# Patient Record
Sex: Male | Born: 1970 | Race: Black or African American | Hispanic: No | Marital: Single | Smoking: Never smoker
Health system: Southern US, Community
[De-identification: ages and names within clinical notes are randomized; demographics above are authoritative.]

## PROBLEM LIST (undated history)

## (undated) DIAGNOSIS — I82409 Acute embolism and thrombosis of unspecified deep veins of unspecified lower extremity: Secondary | ICD-10-CM

## (undated) DIAGNOSIS — I10 Essential (primary) hypertension: Secondary | ICD-10-CM

## (undated) DIAGNOSIS — I2699 Other pulmonary embolism without acute cor pulmonale: Secondary | ICD-10-CM

---

## 1994-08-28 HISTORY — PX: MANDIBLE SURGERY: SHX707

## 2006-02-04 ENCOUNTER — Emergency Department: Payer: Self-pay | Admitting: Unknown Physician Specialty

## 2009-03-28 ENCOUNTER — Ambulatory Visit: Payer: Self-pay | Admitting: Internal Medicine

## 2009-04-19 ENCOUNTER — Inpatient Hospital Stay: Payer: Self-pay | Admitting: Internal Medicine

## 2009-04-28 ENCOUNTER — Ambulatory Visit: Payer: Self-pay | Admitting: Internal Medicine

## 2009-06-15 ENCOUNTER — Ambulatory Visit: Payer: Self-pay | Admitting: Adult Health

## 2009-06-28 ENCOUNTER — Emergency Department: Payer: Self-pay | Admitting: Emergency Medicine

## 2009-09-30 ENCOUNTER — Emergency Department: Payer: Self-pay | Admitting: Emergency Medicine

## 2009-12-01 ENCOUNTER — Ambulatory Visit: Payer: Self-pay | Admitting: Adult Health

## 2009-12-21 ENCOUNTER — Ambulatory Visit: Payer: Self-pay | Admitting: Adult Health

## 2010-01-13 ENCOUNTER — Emergency Department: Payer: Self-pay | Admitting: Emergency Medicine

## 2010-05-15 ENCOUNTER — Emergency Department: Payer: Self-pay | Admitting: Emergency Medicine
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
## 2011-08-31 ENCOUNTER — Inpatient Hospital Stay: Payer: Self-pay | Admitting: Student

## 2011-08-31 LAB — PROTIME-INR
INR: 0.9
Prothrombin Time: 12 secs (ref 11.5–14.7)

## 2011-08-31 LAB — BASIC METABOLIC PANEL
Anion Gap: 10 (ref 7–16)
BUN: 9 mg/dL (ref 7–18)
Calcium, Total: 8.6 mg/dL (ref 8.5–10.1)
Chloride: 104 mmol/L (ref 98–107)
Co2: 29 mmol/L (ref 21–32)
Creatinine: 0.92 mg/dL (ref 0.60–1.30)
Potassium: 3.6 mmol/L (ref 3.5–5.1)

## 2011-08-31 LAB — CBC
MCHC: 33.6 g/dL (ref 32.0–36.0)
RBC: 4.97 10*6/uL (ref 4.40–5.90)

## 2011-08-31 LAB — CK TOTAL AND CKMB (NOT AT ARMC)
CK, Total: 195 U/L (ref 35–232)
CK-MB: 0.6 ng/mL (ref 0.5–3.6)

## 2011-08-31 LAB — TROPONIN I
Troponin-I: <0.02 ng/mL
Troponin-I: 0.03 ng/mL

## 2011-09-01 LAB — CBC WITH DIFFERENTIAL/PLATELET
Eosinophil #: 0.2 10*3/uL (ref 0.0–0.7)
Eosinophil %: 3.8 %
HCT: 40 % (ref 40.0–52.0)
HGB: 13.4 g/dL (ref 13.0–18.0)
Lymphocyte #: 1.9 10*3/uL (ref 1.0–3.6)
MCH: 28.7 pg (ref 26.0–34.0)
MCHC: 33.6 g/dL (ref 32.0–36.0)
MCV: 85 fL (ref 80–100)
Monocyte #: 0.6 10*3/uL (ref 0.0–0.7)
Neutrophil #: 1.4 10*3/uL (ref 1.4–6.5)
Neutrophil %: 35 %
Platelet: 166 10*3/uL (ref 150–440)
RBC: 4.69 10*6/uL (ref 4.40–5.90)

## 2011-09-01 LAB — BASIC METABOLIC PANEL
Anion Gap: 7 (ref 7–16)
Calcium, Total: 8.4 mg/dL — ABNORMAL LOW (ref 8.5–10.1)
Co2: 30 mmol/L (ref 21–32)
EGFR (Non-African Amer.): >60
Glucose: 126 mg/dL — ABNORMAL HIGH (ref 65–99)
Osmolality: 283 (ref 275–301)
Potassium: 4.4 mmol/L (ref 3.5–5.1)
Sodium: 142 mmol/L (ref 136–145)

## 2011-09-01 LAB — HEPATIC FUNCTION PANEL A (ARMC)
Albumin: 3.1 g/dL — ABNORMAL LOW (ref 3.4–5.0)
Alkaline Phosphatase: 86 U/L (ref 50–136)
Bilirubin,Total: 0.3 mg/dL (ref 0.2–1.0)
SGOT(AST): 40 U/L — ABNORMAL HIGH (ref 15–37)
SGPT (ALT): 51 U/L

## 2011-09-01 LAB — LIPID PANEL
Cholesterol: 174 mg/dL (ref 0–200)
HDL Cholesterol: 73 mg/dL — ABNORMAL HIGH (ref 40–60)
Triglycerides: 144 mg/dL (ref 0–200)
VLDL Cholesterol, Calc: 29 mg/dL (ref 5–40)

## 2011-09-01 LAB — CK TOTAL AND CKMB (NOT AT ARMC): CK-MB: <0.5 ng/mL — ABNORMAL LOW (ref 0.5–3.6)

## 2011-09-01 LAB — LIPASE, BLOOD: Lipase: 245 U/L (ref 73–393)

## 2011-09-01 LAB — HEMOGLOBIN A1C: Hemoglobin A1C: 5.3 % (ref 4.2–6.3)

## 2011-09-02 LAB — PROTIME-INR: INR: 1

## 2011-09-03 LAB — PROTIME-INR
INR: 1
Prothrombin Time: 13.4 secs (ref 11.5–14.7)

## 2011-09-04 LAB — PROTIME-INR: INR: 1.1

## 2011-09-05 LAB — PROTIME-INR: INR: 1.2

## 2012-03-17 ENCOUNTER — Inpatient Hospital Stay: Payer: Self-pay | Admitting: Internal Medicine

## 2012-03-17 LAB — CBC WITH DIFFERENTIAL/PLATELET
Basophil %: 2.1 %
Eosinophil %: 1.2 %
HCT: 44.9 % (ref 40.0–52.0)
HGB: 14.4 g/dL (ref 13.0–18.0)
Lymphocyte #: 1.8 10*3/uL (ref 1.0–3.6)
MCH: 27.7 pg (ref 26.0–34.0)
MCV: 86 fL (ref 80–100)
Monocyte #: 0.5 x10 3/mm (ref 0.2–1.0)
RBC: 5.21 10*6/uL (ref 4.40–5.90)
RDW: 14.1 % (ref 11.5–14.5)

## 2012-03-17 LAB — COMPREHENSIVE METABOLIC PANEL
Alkaline Phosphatase: 106 U/L (ref 50–136)
Anion Gap: 10 (ref 7–16)
BUN: 12 mg/dL (ref 7–18)
Bilirubin,Total: 0.3 mg/dL (ref 0.2–1.0)
Calcium, Total: 7.9 mg/dL — ABNORMAL LOW (ref 8.5–10.1)
Chloride: 106 mmol/L (ref 98–107)
Co2: 26 mmol/L (ref 21–32)
Creatinine: 1.14 mg/dL (ref 0.60–1.30)
EGFR (Non-African Amer.): >60
Osmolality: 284 (ref 275–301)
Potassium: 4.1 mmol/L (ref 3.5–5.1)
Sodium: 142 mmol/L (ref 136–145)

## 2012-03-17 LAB — CBC
HCT: 36.1 % — ABNORMAL LOW (ref 40.0–52.0)
MCV: 86 fL (ref 80–100)
RBC: 4.22 10*6/uL — ABNORMAL LOW (ref 4.40–5.90)
WBC: 7 10*3/uL (ref 3.8–10.6)

## 2012-03-17 LAB — PROTIME-INR: Prothrombin Time: 59.6 secs — ABNORMAL HIGH (ref 11.5–14.7)

## 2012-03-17 LAB — ETHANOL: Ethanol %: 0.293 % — ABNORMAL HIGH (ref 0.000–0.080)

## 2012-03-18 LAB — CBC WITH DIFFERENTIAL/PLATELET
Basophil #: 0 10*3/uL (ref 0.0–0.1)
Basophil %: 0.8 %
Eosinophil %: 2.6 %
HCT: 31.3 % — ABNORMAL LOW (ref 40.0–52.0)
Lymphocyte #: 1 10*3/uL (ref 1.0–3.6)
Lymphocyte %: 20.9 %
MCH: 28.5 pg (ref 26.0–34.0)
MCV: 86 fL (ref 80–100)
Monocyte #: 0.6 x10 3/mm (ref 0.2–1.0)
Monocyte %: 11.6 %
Neutrophil #: 3.1 10*3/uL (ref 1.4–6.5)
Platelet: 104 10*3/uL — ABNORMAL LOW (ref 150–440)
RBC: 3.64 10*6/uL — ABNORMAL LOW (ref 4.40–5.90)
RDW: 14 % (ref 11.5–14.5)
WBC: 4.8 10*3/uL (ref 3.8–10.6)

## 2012-03-18 LAB — COMPREHENSIVE METABOLIC PANEL
Alkaline Phosphatase: 78 U/L (ref 50–136)
Calcium, Total: 8 mg/dL — ABNORMAL LOW (ref 8.5–10.1)
Chloride: 102 mmol/L (ref 98–107)
Co2: 25 mmol/L (ref 21–32)
EGFR (African American): >60
EGFR (Non-African Amer.): >60
Osmolality: 280 (ref 275–301)
SGOT(AST): 36 U/L (ref 15–37)
SGPT (ALT): 31 U/L
Sodium: 141 mmol/L (ref 136–145)

## 2012-03-18 LAB — PROTIME-INR: INR: 2.6

## 2012-03-19 LAB — CBC WITH DIFFERENTIAL/PLATELET
Basophil #: 0 10*3/uL (ref 0.0–0.1)
Basophil %: 0.4 %
Eosinophil #: 0.1 10*3/uL (ref 0.0–0.7)
Eosinophil %: 2.6 %
HCT: 32.3 % — ABNORMAL LOW (ref 40.0–52.0)
Lymphocyte #: 0.9 10*3/uL — ABNORMAL LOW (ref 1.0–3.6)
Lymphocyte %: 23 %
MCH: 28.1 pg (ref 26.0–34.0)
MCHC: 32.5 g/dL (ref 32.0–36.0)
MCV: 87 fL (ref 80–100)
Neutrophil %: 59.3 %
RBC: 3.73 10*6/uL — ABNORMAL LOW (ref 4.40–5.90)
RDW: 13.8 % (ref 11.5–14.5)

## 2012-03-19 LAB — PROTIME-INR
INR: 2
Prothrombin Time: 23.1 secs — ABNORMAL HIGH (ref 11.5–14.7)

## 2012-03-20 LAB — PROTIME-INR: INR: 1.5

## 2012-04-28 ENCOUNTER — Emergency Department: Payer: Self-pay | Admitting: Emergency Medicine

## 2012-07-12 ENCOUNTER — Ambulatory Visit: Payer: Self-pay | Admitting: Hematology and Oncology

## 2012-07-28 ENCOUNTER — Ambulatory Visit: Payer: Self-pay | Admitting: Hematology and Oncology

## 2012-12-18 ENCOUNTER — Observation Stay: Payer: Self-pay | Admitting: Internal Medicine

## 2012-12-18 ENCOUNTER — Ambulatory Visit: Payer: Self-pay | Admitting: Internal Medicine

## 2012-12-18 LAB — CBC
HCT: 38.1 % — ABNORMAL LOW (ref 40.0–52.0)
HGB: 12.3 g/dL — ABNORMAL LOW (ref 13.0–18.0)
MCH: 24.2 pg — ABNORMAL LOW (ref 26.0–34.0)
RBC: 5.07 10*6/uL (ref 4.40–5.90)
RDW: 22.2 % — ABNORMAL HIGH (ref 11.5–14.5)

## 2012-12-18 LAB — PROTIME-INR
INR: 1.7
Prothrombin Time: 20.2 secs — ABNORMAL HIGH (ref 11.5–14.7)

## 2012-12-18 LAB — BASIC METABOLIC PANEL
Anion Gap: 4 — ABNORMAL LOW (ref 7–16)
Co2: 30 mmol/L (ref 21–32)
Creatinine: 0.9 mg/dL (ref 0.60–1.30)
EGFR (African American): >60
EGFR (Non-African Amer.): >60
Glucose: 80 mg/dL (ref 65–99)
Osmolality: 278 (ref 275–301)

## 2012-12-19 LAB — CBC WITH DIFFERENTIAL/PLATELET
Basophil #: 0 10*3/uL (ref 0.0–0.1)
HCT: 36.9 % — ABNORMAL LOW (ref 40.0–52.0)
HGB: 12.1 g/dL — ABNORMAL LOW (ref 13.0–18.0)
Lymphocyte #: 1.6 10*3/uL (ref 1.0–3.6)
Lymphocyte %: 33.2 %
MCH: 24.6 pg — ABNORMAL LOW (ref 26.0–34.0)
MCHC: 32.7 g/dL (ref 32.0–36.0)
MCV: 75 fL — ABNORMAL LOW (ref 80–100)
Monocyte #: 0.6 x10 3/mm (ref 0.2–1.0)
Neutrophil %: 48 %
Platelet: 307 10*3/uL (ref 150–440)
RBC: 4.89 10*6/uL (ref 4.40–5.90)
RDW: 22.4 % — ABNORMAL HIGH (ref 11.5–14.5)
WBC: 4.7 10*3/uL (ref 3.8–10.6)

## 2012-12-19 LAB — BASIC METABOLIC PANEL
Anion Gap: 4 — ABNORMAL LOW (ref 7–16)
BUN: 11 mg/dL (ref 7–18)
Calcium, Total: 8.5 mg/dL (ref 8.5–10.1)
Chloride: 106 mmol/L (ref 98–107)
EGFR (African American): >60
EGFR (Non-African Amer.): >60
Glucose: 111 mg/dL — ABNORMAL HIGH (ref 65–99)
Osmolality: 278 (ref 275–301)

## 2012-12-19 LAB — PROTIME-INR
INR: 1.9
Prothrombin Time: 19.5 secs — ABNORMAL HIGH (ref 11.5–14.7)

## 2013-02-18 ENCOUNTER — Ambulatory Visit: Payer: Self-pay

## 2014-03-10 ENCOUNTER — Emergency Department: Payer: Self-pay | Admitting: Emergency Medicine

## 2014-03-10 LAB — CBC
HCT: 43.7 % (ref 40.0–52.0)
HGB: 13.9 g/dL (ref 13.0–18.0)
MCH: 24.8 pg — AB (ref 26.0–34.0)
MCHC: 31.7 g/dL — ABNORMAL LOW (ref 32.0–36.0)
MCV: 78 fL — ABNORMAL LOW (ref 80–100)
Platelet: 231 10*3/uL (ref 150–440)
RBC: 5.59 10*6/uL (ref 4.40–5.90)
RDW: 18.7 % — AB (ref 11.5–14.5)
WBC: 4.7 10*3/uL (ref 3.8–10.6)

## 2014-03-10 LAB — COMPREHENSIVE METABOLIC PANEL
ALBUMIN: 3.6 g/dL (ref 3.4–5.0)
Alkaline Phosphatase: 106 U/L
Anion Gap: 12 (ref 7–16)
BILIRUBIN TOTAL: 0.2 mg/dL (ref 0.2–1.0)
BUN: 8 mg/dL (ref 7–18)
CHLORIDE: 106 mmol/L (ref 98–107)
Calcium, Total: 8.1 mg/dL — ABNORMAL LOW (ref 8.5–10.1)
Co2: 20 mmol/L — ABNORMAL LOW (ref 21–32)
Creatinine: 0.98 mg/dL (ref 0.60–1.30)
EGFR (African American): >60
EGFR (Non-African Amer.): >60
Glucose: 86 mg/dL (ref 65–99)
Osmolality: 273 (ref 275–301)
POTASSIUM: 3.8 mmol/L (ref 3.5–5.1)
SGOT(AST): 63 U/L — ABNORMAL HIGH (ref 15–37)
SGPT (ALT): 42 U/L (ref 12–78)
Sodium: 138 mmol/L (ref 136–145)
Total Protein: 8.5 g/dL — ABNORMAL HIGH (ref 6.4–8.2)

## 2014-03-10 LAB — ETHANOL
ETHANOL LVL: 216 mg/dL
Ethanol %: 0.216 % — ABNORMAL HIGH (ref 0.000–0.080)

## 2014-03-10 LAB — PROTIME-INR
INR: 0.9
Prothrombin Time: 12.2 secs (ref 11.5–14.7)

## 2014-03-10 LAB — ACETAMINOPHEN LEVEL: Acetaminophen: <2 ug/mL

## 2014-03-10 LAB — SALICYLATE LEVEL: Salicylates, Serum: <1.7 mg/dL

## 2014-03-21 ENCOUNTER — Other Ambulatory Visit: Payer: Self-pay

## 2014-03-21 LAB — PROTIME-INR
INR: 1.4
PROTHROMBIN TIME: 17.2 s — AB (ref 11.5–14.7)
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
## 2014-12-15 NOTE — Consult Note (Signed)
Chief Complaint:   Subjective/Chief Complaint nasal packing came out today.  small amount of blood with that occuring, not now.  denies abdominal pain, n/v.  bm this am not dark as previously.   VITAL SIGNS/ANCILLARY NOTES: **Vital Signs.:   24-Jul-13 04:39   Vital Signs Type Routine   Temperature Temperature (F) 98   Celsius 36.6   Temperature Source Oral   Pulse Pulse 67   Respirations Respirations 20   Systolic BP Systolic BP 128   Diastolic BP (mmHg) Diastolic BP (mmHg) 79   Mean BP 95   Pulse Ox % Pulse Ox % 96   Pulse Ox Activity Level  At rest   Oxygen Delivery Room Air/ 21 %   Brief Assessment:   Cardiac Regular    Respiratory clear BS    Gastrointestinal details normal Soft  Nontender  Nondistended  No masses palpable  Bowel sounds normal  No rebound tenderness   Lab Results: Routine Coag:  24-Jul-13 07:05    Prothrombin  18.5   INR 1.5 (INR reference interval applies to patients on anticoagulant therapy. A single INR therapeutic range for coumarins is not optimal for all indications; however, the suggested range for most indications is 2.0 - 3.0. Exceptions to the INR Reference Range may include: Prosthetic heart valves, acute myocardial infarction, prevention of myocardial infarction, and combinations of aspirin and anticoagulant. The need for a higher or lower target INR must be assessed individually. Reference: The Pharmacology and Management of the Vitamin K  antagonists: the seventh ACCP Conference on Antithrombotic and Thrombolytic Therapy. Chest.2004 Sept:126 (3suppl): [id number]. A HCT value >55% may artifactually increase the PT.  In one study,  the increase was an average of 25%. Reference:  "Effect on Routine and Special Coagulation Testing Values of Citrate Anticoagulant Adjustment in Patients with High HCT Values." American Journal of Clinical Pathology 2006;126:400-405.)   Assessment/Plan:  Assessment/Plan:   Assessment 1) melena-from nasal  trauma versus primary GI bleeding.  not recurrent.  2) history of etoh abuse/use-ct showing marked fatty liver. 3) history of protein c deficiency, coumadin supratheraputic on admission-possible secondary to #2    Plan 1) EGD today.  I have discussed the risks benefits and complications of egd to inculde not limited to bleeding infection perforation and sedation and he wishes to proceed.   Electronic Signatures: Barnetta ChapelSkulskie, Toria Monte (MD)  (Signed 24-Jul-13 11:45)  Authored: Chief Complaint, VITAL SIGNS/ANCILLARY NOTES, Brief Assessment, Lab Results, Assessment/Plan   Last Updated: 24-Jul-13 11:45 by Barnetta ChapelSkulskie, Daiwik Buffalo (MD)
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
## 2014-12-18 NOTE — Discharge Summary (Signed)
PATIENT NAMJudyann Lamb:  [name] MR#:  [id number] DATE OF BIRTH:  07-Nov-1970  DATE OF ADMISSION:  12/18/2012 DATE OF DISCHARGE:  12/19/2012  ADMISSION DIAGNOSIS:  Deep vein thrombosis and history of pulmonary embolus with pulmonary infarct.   DIAGNOSTICS:  CT scan shows residual area of pulmonary arterial embolic disease with the left upper lobe.  The amount of thrombus burden has decreased when compared to the previous study on 08/31/2011. Findings consistent with infarcted area of lung within the right lung base.   Lower extremity Doppler showed bilateral nonocclusive thrombus in the superficial femoral vein, mid and distal portions, and left popliteal vein. This may be a chronic component.  HOSPITAL COURSE: This is a 44 year old male who presented with lower extremity edema and swelling and pain and found to have a DVT, probably acute on chronic. He has had a lot of issues with his Coumadin level despite being compliant with the medication. For further details, please refer to the H and P.  1.  Pulmonary emboli, old, with pulmonary infarct on CT scan and likely new DVT of left lower extremity with increased swelling and warmth. The patient had subtherapeutic INR.  It has been a challenge apparently is an outpatient to keep his INR therapeutic as it is either supratherapeutic or subtherapeutic.  I spoke with case management about his discharge plan.  At this time we are unable to do Lovenox assistance program or Xarelto, but [employer] is going to be helping him with Xarelto with patient assistance. This is going to take about 6 to 8 weeks. The patient has to fill appropriate information out and send this information to the IRS in order to get patient assistance, so at his discharge we are going to bridge him with Lovenox keeping a goal INR of 2.5 to 3.5. Currently his INR is 1.9. If it is greater than 2, he will be able to be discharged and [facility]. 2.  History of pulmonary  hypertension, which is stable.  3.  History of alcohol abuse.  4.  Chronic anemia, which is stable.   DISCHARGE MEDICATIONS: 1.  Lovenox 75 mg sub-Q every 12 hours x 2 days.  2.  Coumadin 7.5 mg daily.   DISCHARGE DIET: Regular and a Coumadin diet.   DISCHARGE ACTIVITY: As tolerated.   DISCHARGE FOLLOWUP: The patient [facility] 1 to 2 days for an INR. The patient is to follow up with [employer] for patient assistance program for Xarelto.  A prescription was given to the patient for Xarelto.  DISCHARGE CONDITION:  The patient is stable for discharge   TIME SPENT: Approximately 45 minutes.  ____________________________ Janyth ContesSital P. Juliene PinaMody, MD spm:sb D: 12/19/2012 14:33:37 ET T: 12/19/2012 14:44:23 ET JOB#: [id number]  cc: Catherina Pates P. Juliene PinaMody, MD, <Dictator> [facility] Neetu Carrozza P Nita Whitmire MD ELECTRONICALLY SIGNED 12/19/2012 15:01

## 2014-12-18 NOTE — Discharge Summary (Signed)
PATIENT NAMJudyann Munson:  Giglia, Bazil MR#:  [id number] DATE OF BIRTH:  June 13, 1971  DATE OF ADMISSION:  12/18/2012 DATE OF DISCHARGE:  12/19/2012  ADDENDUM    His INR actually at discharge is 1.7 after repeat. We will go ahead and give him 4 days of Lovenox 75 mg subcutaneous q. 12 hours instead of 2 days, and he will follow up with [facility] early next week. He has an appointment on Wednesday, but we will try to move this up to Monday or Tuesday. Once again, goal INR is between 2.5 to 3.5.    ____________________________ Jentry Warnell P. Juliene PinaMody, MD spm:lg D: 12/19/2012 15:01:42 ET T: 12/19/2012 15:36:14 ET JOB#: [id number]  cc: Tyland Klemens P. Juliene PinaMody, MD, <Dictator> [facility] Trinity Hyland P Lativia Velie MD ELECTRONICALLY SIGNED 12/19/2012 20:40

## 2014-12-18 NOTE — H&P (Signed)
PATIENT NAMECAETANO, [name] MR#:  [id number] DATE OF BIRTH:  [birth year]  DATE OF ADMISSION:  12/18/2012  PRIMARY CARE PHYSICIAN: [facility].   CHIEF COMPLAINT: Sent for a sonogram today and was found to have bilateral lower extremity DVT and was referred over to the ER for further evaluation.    HISTORY OF PRESENT ILLNESS: This is a 44 year old man with history of PE, DVT status post IVC filter and protein C deficiency. He has been on Coumadin. He has had up and down levels on his Coumadin. He had a low level today and was advised to up his Coumadin to 7.5 mg daily. Prior, he has taking 5 mg on Monday and 7.5 mg the rest of the week. The last time he checked his INR was 3 weeks prior. The patient is having pain in the left lower extremity for the past week or so, stiff in his legs. He does get short of breath with movement and pain in his leg with movement. He has had some sweats and slight fever but not measured. In the ER, the patient had a CT scan of the chest that showed residual area of pulmonary arterial emboli disease within the left upper lobe. Thrombus burden has decreased when compared to previous study back in January 2013. Findings consistent with infarcted areas of lung within the right lung base. He also had the ultrasound of the lower extremities today that showed nonocclusive thrombus bilaterally in the superficial femoral vein, mid and distal portions of the left popliteal vein. May be a chronic component to this. In the ER, care manager looked into trying to get Lovenox approved but was unable to do this since his pharmacy was closed. The patient's INR was slightly low at 1.7. Hospitalist services were then asked to admit as an observation until medications can be looked into in the morning.   PAST MEDICAL HISTORY:  1. DVT, pulmonary embolism, status post IVC filter.  2. Protein C deficiency.  3. Pulmonary hypertension.  4. History of hypertension.   PAST SURGICAL HISTORY:  IVC filter and a jaw surgery.   MEDICATIONS: Include Coumadin 7.5 mg daily.   ALLERGIES: No known drug allergies.   SOCIAL HISTORY: Drinks 4 beers every other day. No IV drugs. No smoking. Not working currently. Lives with his girlfriend.   FAMILY HISTORY: Father died of a MI in his early 82s. Mother had no medical problems.   REVIEW OF SYSTEMS:  CONSTITUTIONAL: Positive for sweating while sleeping. Slight fever but not measured. Weight loss, 26 pounds. No weakness or fatigue.  EYES: He does wear glasses.  EARS, NOSE, MOUTH AND THROAT: Positive for runny nose. No sore throat. No difficulty swallowing.  CARDIOVASCULAR: Occasional chest pain with activity.  RESPIRATORY: Positive for shortness of breath with activity. No cough. No sputum. No hemoptysis.  GASTROINTESTINAL: No nausea. No vomiting. No abdominal pain. No diarrhea. No constipation. No bright red blood per rectum. No melena.  GENITOURINARY: No burning on urination. No hematuria.  MUSCULOSKELETAL: Positive for joint pain in the elbows.  INTEGUMENT: The patient has noticed some redness on his lower extremity over the past week.  NEUROLOGIC: No fainting or blackouts.  PSYCHIATRIC: No anxiety or depression.  ENDOCRINE: No thyroid problems.  HEMATOLOGIC AND LYMPHATIC: Positive for anemia.   PHYSICAL EXAMINATION:  VITAL SIGNS: Temperature 98.1, pulse 72, respirations 18, blood pressure 133/79, pulse ox 97% on room air.  GENERAL: No respiratory distress.  EYES: Conjunctivae and lids normal. Pupils equal, round and  reactive to light. Extraocular muscles intact. No nystagmus.  EARS, NOSE, MOUTH AND THROAT: Tympanic membranes: No erythema. Nasal mucosa: No erythema. Throat: No erythema, no exudate seen. Lips and gums: No lesions.  NECK: No JVD. No bruits. No lymphadenopathy. No thyromegaly. No thyroid nodules palpated.  RESPIRATORY: Lungs clear to auscultation. No use of accessory muscles to breathe. No rhonchi, rales or wheeze heard.   CARDIOVASCULAR SYSTEM: S1, S2 normal. No gallops, rubs or murmurs heard. Carotid upstroke 2+ bilaterally. No bruits.  EXTREMITIES: Dorsalis pedis pulses 2+ bilaterally. Positive edema bilateral lower extremities, left greater than right.  ABDOMEN: Soft, nontender. No organo- or splenomegaly. Normoactive bowel sounds. No masses felt.  LYMPHATIC: No lymph nodes in the neck.  MUSCULOSKELETAL: Lower extremity edema bilaterally, left greater than right. No clubbing, cyanosis.  SKIN: On the left lower extremity, slight increased warmth and slight redness.  NEUROLOGIC: Cranial nerves II through XII grossly intact. Deep tendon reflexes 2+ bilateral lower extremities.  PSYCHIATRIC: The patient is oriented to person, place and time.   EKG: Ordered by me.   ASSESSMENT AND PLAN:  1. Pulmonary infarct seen on CT scan. Likely new deep vein thrombosis in the left lower extremity with new swelling and redness of the left lower extremity. The patient does have a history of protein C deficiency. The patient was found to have a subtherapeutic INR. Lovenox 1 mg/kg subcutaneous q.12 hours to be given. I will increase the patient to 8 mg per day of Coumadin. The patient already took his dose of 7.5 today. I will check an INR tomorrow. The care manager will have to look into whether we can switch him to Lovenox since the patient does have fluctuating levels of the Coumadin. One of the last admissions, his INR was 7 and he did have a severe nosebleed requiring a transfusion. This admission, it looks like a new deep vein thrombosis and symptomatic with leg swelling, pain and erythema. This would be an indication to switch over to another treatment option. In this patient, I think Lovenox 1 mg/kg subcutaneous q.12 hours would be a sufficient switch choice, and this will have to be up to the care manager to see whether the cost would be prohibitive or not. I think that Xarelto would be too costly for this patient, although would  probably be the easiest agent to prescribe. If all else fails, we could do Coumadin at an increased dose to try to keep his INR above 2.5 and under 3.5 with a Lovenox bridge.  2. 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 This will be tough to treat.  3. History of alcohol abuse: No signs of withdrawal in the past. Advise cessation.  4. Anemia: This is chronic for the patient. His hemoglobin is actually higher than it was the last time he was here in the hospital. Continue to monitor.   TIME SPENT ON ADMISSION: 50 minutes   ____________________________ Herschell Dimesichard J. Renae GlossWieting, MD rjw:gb D: 12/18/2012 22:10:15 ET T: 12/18/2012 22:29:21 ET JOB#: [id number]  cc: Herschell Dimesichard J. Renae GlossWieting, MD, <Dictator> [facility] Salley ScarletICHARD J Lela Murfin MD ELECTRONICALLY SIGNED 12/28/2012 12:52

## 2014-12-20 NOTE — Discharge Summary (Signed)
PATIENT NAMJudyann Lamb:  Lamb, Jeremy MR#:  [id number] DATE OF BIRTH:  1971-05-22  DATE OF ADMISSION:  08/31/2011 DATE OF DISCHARGE:  09/05/2011  CONSULTING PHYSICIAN: Dr. Belia HemanKasa from pulmonary.   CHIEF COMPLAINT: Chest pain.   DISCHARGE DIAGNOSES:  1. Pulmonary embolus and deep vein thrombosis. 2. History of protein C deficiency. 3. Hypertension. 4. Alcohol abuse.   DISCHARGE MEDICATIONS:  1. Amlodipine 5 mg daily.  2. Coumadin 12.5 mg daily.  3. Lovenox 75 mg subcutaneous every 12 hours until INR is between 2 and 3.   DISPOSITION: Home with home nurse for INR draw.   DIET: Low sodium diet.   ACTIVITY: As tolerated.   FOLLOW UP: Please follow up and check INR on Friday and forward to [facility] and if they will not recommend medication changes it can be forwarded the hospitalist services here in the hospital for further recommendations and I will follow up.   HISTORY OF PRESENT ILLNESS: Please see the full history and physical dictated on 08/31/2011 by Dr. Lafayette DragonFirozvi. But briefly, this is a [age] year old male with history of protein C deficiency and pulmonary embolus who is supposed to be on lifelong Coumadin and has not been for the past several months presented with chest pain and was found to have PE. He was admitted to the hospitalist services for further evaluation and management.   LABORATORY, DIAGNOSTIC AND RADIOLOGICAL DATA: Troponin negative x3. TSH 4.84. WBC on arrival 3.7, hemoglobin 14.3, INR 0.9. INR on discharge 1.2. CT of the chest for PE with contrast showing persistent thrombus within the distal left main pulmonary artery as well as the left upper lobe and lingula. These findings likely represent chronic thrombus. Consolidative infiltrate versus atelectasis versus possibly a mass within the right lung base. Hepatic steatosis. Ultrasound lower extremities bilaterally showing nonocclusive deep vein thrombosis in the right and left superficial femoral and popliteal veins. PET scan  showing no abnormal intake of the pharmaceutical agent in the area in the right lung base. No abnormal accumulation of the contrast anywhere else in the body. Gallstones are present.   HOSPITAL COURSE: Patient was admitted to the hospitalist service and started on Lovenox as well as Coumadin. Patient, of note, has protein C deficiency and supposed to be on lifelong Coumadin, however, has not been on it for several months. Patient claimed to be on Coumadin 5.5 mg as an outpatient several years ago. His Coumadin was slowly increased from 7.5 to 10 to 12.5 mg and his INR is still subtherapeutic. At this point, patient has had Lovenox bridging until now. His current INR is 1.2. I discussed the case with pharmacy who recommended continuation of 12.5 mg dose for now. Patient has no follow up set up for now. He is [facility] who has had compliance issues in the past and they will not see him until he forwards some paperwork to them. At this point we would set up home nurse who can draw the INR and perhaps forward the INR to [facility] for further instructions and titration of the Coumadin. If they will not deal with this issue then the INR could be forwarded to the hospitalist services here and I would follow up with it. Patient has no insurance and we would arrange for Lovenox injections for 14 doses to cover for him for a week. He is aware that he needs to be on Coumadin lifelong and needs follow up and INR checks. Of note, patient did have a PET scan which  did not show any evidence for malignancy. This was done because initial CT of the chest showed possible right lung mass. Patient stated that he had a PE and infarct and fibrosis in the right lung base and that likely is the culprit and this is not a malignancy. Patient did have elevated blood pressure and was initially started on a beta blocker. However, patient had sinus brady while he was sleeping and was asymptomatic. The beta blocker was stopped and  patient has tolerated amlodipine with good blood pressure control. Patient also has alcohol abuse history and was counseled about cessation. He was also placed on CIWA protocol and did not have any withdrawal symptoms. At this point he will be discharged with follow up as described above.   CODE STATUS: FULL CODE.       TOTAL TIME SPENT: 40 minutes.  ____________________________ Krystal Eaton, MD sa:cms D: 09/05/2011 13:59:41 ET T: 09/06/2011 13:49:55 ET JOB#: [id number]  cc: Krystal Eaton, MD, <Dictator> Krystal Eaton MD ELECTRONICALLY SIGNED 09/08/2011 20:37

## 2014-12-20 NOTE — H&P (Signed)
PATIENT NAMEEMON, [name] MR#:  [id number] DATE OF BIRTH:  Jan 02, 1971  DATE OF ADMISSION:  08/31/2011  REFERRING PHYSICIAN: Dr. Lorenso Courier   PRIMARY CARE PHYSICIAN: None.   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 He has been off of it for the last two months. 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 The patient has been having intermittent left sternal chest pain dull at times, sharp at times, relieved by time, lasting 10 minutes, associated with shortness of breath. He has also had similar pains in his left hypogastric left lower quadrant. No nausea, vomiting, or diarrhea. No fevers, chills, or shakes. He said he's had some minimal shortness of breath. He states he usually is on Coumadin, however, was incarcerated due to driving under the influence charge. He was released 2-1/2 months ago at which time he has had no job and has not been able to afford Coumadin. Prior to his incarceration, he was going to the [facility]. We are asked to admit the patient for PE and chest pain.   PAST MEDICAL HISTORY:  1. History of lower extremity DVTs/PEs.  2. Protein C deficiency.  3. Pulmonary hypertension. 4. Right heart strain.   MEDICATIONS: None currently.   DRUG ALLERGIES: No known drug allergies.   FAMILY HISTORY: Father died of MI at age 78. Mother is living and well.   SOCIAL HISTORY: He is unemployed. Used to work in [employer]. Drinks about a 6 pack a day. No illicit drugs. No history of smoking.   REVIEW OF SYSTEMS: CONSTITUTIONAL: No fever but he has fatigue. No weakness. EYES: No double vision, pain, or redness. He has had blurred vision at times. He does wear glasses. ENT: No tinnitus, ear pain, hearing loss, seasonal allergies, epistaxis, or discharge. RESPIRATORY: No cough, wheezing, hemoptysis,  or painful respirations. CARDIOVASCULAR: Has chest pain but no orthopnea or arrhythmia. He does have dyspnea on exertion. No palpitations or syncope. RESPIRATORY: No cough, wheezing, or hemoptysis. He does have dyspnea. GI: No nausea, vomiting, or diarrhea. He does have left lower quadrant abdominal pain. GU: No dysuria, hematuria, renal calculi, frequency, incontinence. GU male. No sores, discharge, prostatitis, or erectile dysfunction. ENDOCRINE: No polyuria, nocturia, thyroid problems, increased sweating, heat or cold intolerance. HEME/LYMPH: No anemia, easy bruising, or swollen glands. INTEGUMENTARY: No acne, rash, lesions, change in mole, hair or skin. MUSCULOSKELETAL: Pain in the right calf and some joint pain. No pain in back, shoulder, arthritis, swelling, gout. NEUROLOGIC: No numbness, weakness, epilepsy, tremor, vertigo, ataxia. PSYCH: No anxiety, insomnia, ADD, bipolar, or depression.   PHYSICAL EXAMINATION:   VITAL SIGNS: Temperature 97, heart rate 76, respiratory rate 18, blood pressure 146/77, sating 100% on room air.   GENERAL: The patient is well developed, well nourished in no apparent distress, alert and oriented x3.   HEENT: Pupils equal and reactive to light and accommodation. Extraocular movements intact. Anicteric sclerae. No difficulty hearing. Oropharynx clear.   NECK: No JVD. No thyromegaly. No lymphadenopathy. No carotid bruits.   LUNGS: Clear to auscultation. No adventitious breath sounds. No use of accessory muscles.   CARDIOVASCULAR: Regular rate and rhythm. Normal S1, S2. No murmurs, gallops, or rubs appreciated. No lower extremity edema. 2+ dorsalis pedis pulses.   BREASTS: No obvious masses.   ABDOMEN: Soft, nontender, nondistended. Positive bowel sounds.  GU: Deferred.   MUSCULOSKELETAL: Strength 5/5. No clubbing, cyanosis, or degenerative joint disease.   SKIN: No rashes, lesions, or erythema. He does have tattoos. Marland Kitchen.   LYMPH: No lymphadenopathy in the  cervical area.   NEUROLOGIC: Cranial nerves II through XII intact. Strength 5/5. Follows commands.   PSYCH: Alert and oriented x3.   LABORATORY, DIAGNOSTIC, AND RADIOLOGICAL DATA: Glucose 144, BUN 9, creatinine 0.92, sodium 143, potassium 3.6, chloride 104, bicarb 29, anion gap 10. Troponin less than 0.02. WBC 3.7, hemoglobin 14.3, hematocrit 42.6, platelets 180, MCV 86. INR 0.9.   CT scan of chest showed persistent thrombus within the left main pulmonary artery as well as left upper lobe lingula vessels. Consolidative infiltrate versus atelectasis versus possibly a mass within the right lung base. Hepatic steatosis.   Chest x-ray shows pleural thickening. Pleural and parenchymal fibrosis in the right lung base. There is minimal pleural parenchymal fibrosis left lung base.  EKG shows normal sinus rhythm.   ASSESSMENT AND PLAN: This is a pleasant 44 year old male with past medical history of PE and Protein C deficiency who presents with chest pain.  1. History of PE, off Coumadin for two months, history of severe heart strain, pulmonary hypertension. The patient has IVC filter. Will consult Pulmonary as I'm not sure why he would get PE unless he's forming clots higher up past the IVC filter. Will continue Lovenox and Coumadin.  2. Hypertension. Will continue metoprolol.  3. Chest pain most likely from PE, however, will cycle cardiac enzymes. Check lipid panel. Add aspirin, beta-blocker, nitroglycerin, and p.r.n. morphine.  4. Protein C deficiency, on lifelong Coumadin. INR should be between 2 and 3. Will check  lower extremity Doppler's.  5. Hyperglycemia. Check A1c.  6. Alcohol abuse. Counseled about cessation of alcohol use and CIWA protocol. 7. Abdominal pain. Cannot do CT of the chest with IV contrast. This could be ischemic colitis due to clots. Will check lipase as patient has an alcohol abuse history.  8. History of increased LFTs. Will check LFTs. 9. DVT prophylaxis. Aspirin and  Lovenox.  TOTAL TIME SPENT ON ADMISSION: 55 minutes.   CODE STATUS: FULL CODE.  Thank you for allowing me to participate in the care of this patient.   ____________________________ Corie ChiquitoAmir A. Lafayette DragonFirozvi, MD aaf:drc D: 08/31/2011 16:12:09 ET T: 08/31/2011 17:03:22 ET JOB#: [id number]  cc: Karolee OhsAmir A. Lafayette DragonFirozvi, MD, <Dictator> Yevonne PaxSaadat A. Khan, MD Karolee OhsAMIR Laverda PageA Xiadani Damman MD ELECTRONICALLY SIGNED 09/01/2011 16:48

## 2016-07-22 ENCOUNTER — Emergency Department
Admission: EM | Admit: 2016-07-22 | Discharge: 2016-07-22 | Disposition: A | Discharge status: Home / Self Care | Payer: Self-pay | Attending: Emergency Medicine | Admitting: Emergency Medicine

## 2016-07-22 ENCOUNTER — Emergency Department: Payer: Self-pay

## 2016-07-22 ENCOUNTER — Encounter: Payer: Self-pay | Admitting: Emergency Medicine

## 2016-07-22 DIAGNOSIS — I82403 Acute embolism and thrombosis of unspecified deep veins of lower extremity, bilateral: Secondary | ICD-10-CM | POA: Insufficient documentation

## 2016-07-22 HISTORY — DX: Acute embolism and thrombosis of unspecified deep veins of unspecified lower extremity: I82.409

## 2016-07-22 LAB — BASIC METABOLIC PANEL
Anion gap: 8 (ref 5–15)
BUN: 14 mg/dL (ref 6–20)
CHLORIDE: 105 mmol/L (ref 101–111)
CO2: 28 mmol/L (ref 22–32)
Calcium: 8.7 mg/dL — ABNORMAL LOW (ref 8.9–10.3)
Creatinine, Ser: 1 mg/dL (ref 0.61–1.24)
GFR calc non Af Amer: >60 mL/min (ref >60)
Glucose, Bld: 114 mg/dL — ABNORMAL HIGH (ref 65–99)
POTASSIUM: 3.9 mmol/L (ref 3.5–5.1)
SODIUM: 141 mmol/L (ref 135–145)

## 2016-07-22 LAB — PROTIME-INR
INR: 1.14
Prothrombin Time: 14.7 seconds (ref 11.4–15.2)

## 2016-07-22 LAB — CBC
HEMATOCRIT: 44.4 % (ref 40.0–52.0)
HEMOGLOBIN: 14.9 g/dL (ref 13.0–18.0)
MCH: 27.1 pg (ref 26.0–34.0)
MCHC: 33.5 g/dL (ref 32.0–36.0)
MCV: 81 fL (ref 80.0–100.0)
Platelets: 176 10*3/uL (ref 150–440)
RBC: 5.48 MIL/uL (ref 4.40–5.90)
RDW: 16.3 % — ABNORMAL HIGH (ref 11.5–14.5)
WBC: 5.1 10*3/uL (ref 3.8–10.6)

## 2016-07-22 LAB — APTT: aPTT: 35 seconds (ref 24–36)

## 2016-07-22 MED ORDER — APIXABAN 5 MG PO TABS: ORAL_TABLET | ORAL | 1 refills | Status: DC

## 2016-07-22 NOTE — ED Notes (Signed)
Pt. Family reassured again that pt. Is indeed in Ultrasound suite. Updated ETA 5 minutes per US.

## 2016-07-22 NOTE — ED Provider Notes (Signed)
[facility] Emergency Department Provider Note  ____________________________________________   First MD Initiated Contact with Patient 07/22/16 [phone]     (approximate)  I have reviewed the triage vital signs and the nursing notes.   HISTORY  Chief Complaint DVT    HPI [name] is a 45 y.o. male with a history of multiple episodes of DVTwho presents for evaluation of gradual onset persistent bilateral lower extremity pain.  He is not currently anticoagulated - he was on warfarin while in prison, but did not have the money to continue treatment after he got out about two months ago.  Reports developing pain over the last 3 weeks.  Became severe tonight, nothing makes better nor worse.  Has IVC filter in place.  Occasional mild chest pains over the last few days.  Denies swelling in legs.  Denies fever/chills, SOB, abd pain, N/V/D.  No genetic testing of which he is aware, no relatives with hx of blood clots.   Past Medical History:  Diagnosis Date  . Deep vein thrombosis (DVT) (HCC)     There are no active problems to display for this patient.   History reviewed. No pertinent surgical history.  Prior to Admission medications   Medication Sig Start Date End Date Taking? Authorizing Provider  apixaban (ELIQUIS) 5 MG TABS tablet Take 2 tablets (10 mg) PO twice daily x 7 days.  Then take 1 tablet (5 mg) twice daily. 07/22/16   Loleta Rose, MD    Allergies Patient has no known allergies.  No family history on file.  Social History Social History  Substance Use Topics  . Smoking status: Never Smoker  . Smokeless tobacco: Never Used  . Alcohol use Yes    Review of Systems Constitutional: No fever/chills Eyes: No visual changes. ENT: No sore throat. Cardiovascular: Occasional mild chest pain. Respiratory: Denies shortness of breath. Gastrointestinal: No abdominal pain.  No nausea, no vomiting.  No diarrhea.  No  constipation. Genitourinary: Negative for dysuria. Musculoskeletal: Negative for back pain.  Pain in bilateral legs over 3 weeks. Skin: Negative for rash. Neurological: Negative for headaches, focal weakness or numbness.  10-point ROS otherwise negative.  ____________________________________________   PHYSICAL EXAM:  VITAL SIGNS: ED Triage Vitals  Enc Vitals Group     BP 07/22/16 0428 (!) 141/94     Pulse Rate 07/22/16 0428 94     Resp 07/22/16 0428 20     Temp 07/22/16 0428 97.8 F (36.6 C)     Temp Source 07/22/16 0428 Oral     SpO2 07/22/16 0428 91 %     Weight 07/22/16 0430 175 lb (79.4 kg)     Height 07/22/16 0430 5\' 8"  (1.727 m)     Head Circumference --      Peak Flow --      Pain Score 07/22/16 0430 6     Pain Loc --      Pain Edu? --      Excl. in GC? --     Constitutional: Alert and oriented. Well appearing and in no acute distress. Eyes: Conjunctivae are normal. PERRL. EOMI. Head: Atraumatic. Nose: No congestion/rhinnorhea. Mouth/Throat: Mucous membranes are moist.  Oropharynx non-erythematous. Neck: No stridor.  No meningeal signs.   Cardiovascular: Normal rate, regular rhythm. Good peripheral circulation. Grossly normal heart sounds. Respiratory: Normal respiratory effort.  No retractions. Lungs CTAB. Gastrointestinal: Soft and nontender. No distention.  Musculoskeletal: No lower extremity tenderness nor edema. No gross deformities of extremities. No tenderness to  palpation along the vascular distribution in the popliteal fossa. Neurologic:  Normal speech and language. No gross focal neurologic deficits are appreciated.  Skin:  Skin is warm, dry and intact. No rash noted. Psychiatric: Mood and affect are normal. Speech and behavior are normal  ____________________________________________   LABS (all labs ordered are listed, but only abnormal results are displayed)  Labs Reviewed  BASIC METABOLIC PANEL - Abnormal; Notable for the following:        Result Value   Glucose, Bld 114 (*)    Calcium 8.7 (*)    All other components within normal limits  CBC - Abnormal; Notable for the following:    RDW 16.3 (*)    All other components within normal limits  APTT  PROTIME-INR   ____________________________________________  EKG  ED ECG REPORT I, Norberta Stobaugh, the attending physician, personally viewed and interpreted this ECG.  Date: 07/22/2016 EKG Time: 04:37 Rate: 83 Rhythm: normal sinus rhythm QRS Axis: normal Intervals: LVH, otherwise normal ST/T Wave abnormalities: normal Conduction Disturbances: none Narrative Interpretation: unremarkable  ____________________________________________  RADIOLOGY   [place] Venous Img Lower Bilateral  Result Date: 07/22/2016 CLINICAL DATA:  Bilateral lower extremity pain and edema for several weeks. History of previous deep venous thrombosis, not currently on anticoagulation. EXAM: BILATERAL LOWER EXTREMITY VENOUS DOPPLER ULTRASOUND TECHNIQUE: Gray-scale sonography with graded compression, as well as color Doppler and duplex ultrasound were performed to evaluate the lower extremity deep venous systems from the level of the common femoral vein and including the common femoral, femoral, profunda femoral, popliteal and calf veins including the posterior tibial, peroneal and gastrocnemius veins when visible. The superficial great saphenous vein was also interrogated. Spectral Doppler was utilized to evaluate flow at rest and with distal augmentation maneuvers in the common femoral, femoral and popliteal veins. COMPARISON:  None. FINDINGS: RIGHT LOWER EXTREMITY Common Femoral Vein: No evidence of thrombus. Normal compressibility, respiratory phasicity and response to augmentation. Saphenofemoral Junction: No evidence of thrombus. Normal compressibility and flow on color Doppler imaging. Profunda Femoral Vein: No evidence of thrombus. Normal compressibility and flow on color Doppler imaging. Femoral Vein:  Echogenic noncompressible expansile thrombus demonstrated in the distal femoral vein. Popliteal Vein: Focal extension of thrombus into the proximal popliteal vein but the mid and distal popliteal vein are patent. Calf Veins: Echogenic expansile thrombus demonstrated in the posterior tibial vein. Superficial Great Saphenous Vein: No evidence of thrombus. Normal compressibility and flow on color Doppler imaging. Venous Reflux:  None. Other Findings:  None. LEFT LOWER EXTREMITY Common Femoral Vein: No evidence of thrombus. Normal compressibility, respiratory phasicity and response to augmentation. Saphenofemoral Junction: No evidence of thrombus. Normal compressibility and flow on color Doppler imaging. Profunda Femoral Vein: Echogenic noncompressible thrombus is demonstrated. Femoral Vein: Echogenic noncompressible thrombus is demonstrated. Popliteal Vein: Echogenic nonocclusive expansile thrombus demonstrated in the left popliteal vein. Calf Veins: Echogenic expansile thrombus is demonstrated in the posterior tibial and peroneal veins. Superficial Great Saphenous Vein: No evidence of thrombus. Normal compressibility and flow on color Doppler imaging. Venous Reflux:  None. Other Findings:  None. IMPRESSION: Positive for bilateral deep venous thrombosis involving the right femoral, popliteal, and posterior tibial veins as well as the left profunda femoral, femoral, popliteal, and calf veins. These results were called by telephone at the time of interpretation on 07/22/2016 at 6:54 am to Dr. Loleta RoseORY Erandy Mceachern , who verbally acknowledged these results. Electronically Signed   By: Burman NievesWilliam  Stevens M.D.   On: 07/22/2016 06:57    ____________________________________________   PROCEDURES  Procedure(s) performed:   Procedures   Critical Care performed: No ____________________________________________   INITIAL IMPRESSION / ASSESSMENT AND PLAN / ED COURSE  Pertinent labs & imaging results that were available  during my care of the patient were reviewed by me and considered in my medical decision making (see chart for details).  Strong history of DVT, currently not coagulated.  IVC filter in place is reassuring.  Vitals normal.  No external physical evidence of DVT, but will proceed with U/S.   Clinical Course as of Jul 22 734  Sat Jul 22, 2016  [id number] Spoke by phone with radiologist who called to discuss the results.  Patient has extensive bilateral clot burden.  Remains with stable vitals, NAD.  Patient has never tried Eliquis nor Xarelto in the past.  Will start him on Eliquis with 30-day trial starter pack.  Stressed to him the importance of following up with hematology given the recurrent episodes of DVT.  I gave my usual and customary return precautions.  US Venous Img Lower Bilateral [CF]    Clinical Course User Index [CF] Loleta Roseory Huie Ghuman, MD    ____________________________________________  FINAL CLINICAL IMPRESSION(S) / ED DIAGNOSES  Final diagnoses:  Deep vein thrombosis (DVT) of both lower extremities, unspecified chronicity, unspecified vein (HCC)     MEDICATIONS GIVEN DURING THIS VISIT:  Medications - No data to display   NEW OUTPATIENT MEDICATIONS STARTED DURING THIS VISIT:  New Prescriptions   APIXABAN (ELIQUIS) 5 MG TABS TABLET    Take 2 tablets (10 mg) PO twice daily x 7 days.  Then take 1 tablet (5 mg) twice daily.    Modified Medications   No medications on file    Discontinued Medications   WARFARIN SODIUM PO    Take 1 tablet by mouth daily.     Note:  This document was prepared using Dragon voice recognition software and may include unintentional dictation errors.    Loleta Roseory Tayelor Osborne, MD 07/22/16 [phone]

## 2016-07-22 NOTE — Discharge Instructions (Signed)
You have been seen in the Emergency Department (ED) today and diagnosed with a deep vein thrombosis (DVT), which is a blood clot in one of your veins.  As we discussed, we are starting you on a blood thinner.  Though these carry with them the risk of bleeding, the risks of not treating your DVT (stroke, a clot in your lungs, etc.) are greater than the risk of taking the medication. ° °Please follow up with the doctor listed in these papers as recommended regarding today's ED visit.     ° °Return to the ED if you have worsening pain, ANY trouble breathing, chest pain, or other new or worsening symptoms that concern you. ° °

## 2016-07-22 NOTE — ED Triage Notes (Signed)
Pt reports that he was first diagnosed with bilateral blood clots in his legs in 2010. Pt states that he has been on Coumadin since that time. Pt was released from prison about three months ago and ran out of his medication about 1 1/2 month ago without funds to purchase more. Pt states that he is feeling pain in both calves at this time.

## 2016-07-30 ENCOUNTER — Emergency Department: Payer: Self-pay

## 2016-07-30 ENCOUNTER — Emergency Department
Admission: EM | Admit: 2016-07-30 | Discharge: 2016-07-30 | Disposition: A | Discharge status: Home / Self Care | Payer: Self-pay | Attending: Emergency Medicine | Admitting: Emergency Medicine

## 2016-07-30 ENCOUNTER — Encounter: Payer: Self-pay | Admitting: *Deleted

## 2016-07-30 DIAGNOSIS — Z23 Encounter for immunization: Secondary | ICD-10-CM | POA: Insufficient documentation

## 2016-07-30 DIAGNOSIS — S01112A Laceration without foreign body of left eyelid and periocular area, initial encounter: Secondary | ICD-10-CM | POA: Insufficient documentation

## 2016-07-30 DIAGNOSIS — F10920 Alcohol use, unspecified with intoxication, uncomplicated: Secondary | ICD-10-CM

## 2016-07-30 DIAGNOSIS — Y999 Unspecified external cause status: Secondary | ICD-10-CM | POA: Insufficient documentation

## 2016-07-30 DIAGNOSIS — S01511A Laceration without foreign body of lip, initial encounter: Secondary | ICD-10-CM | POA: Insufficient documentation

## 2016-07-30 DIAGNOSIS — Y929 Unspecified place or not applicable: Secondary | ICD-10-CM | POA: Insufficient documentation

## 2016-07-30 DIAGNOSIS — S0101XA Laceration without foreign body of scalp, initial encounter: Secondary | ICD-10-CM | POA: Insufficient documentation

## 2016-07-30 DIAGNOSIS — Y9389 Activity, other specified: Secondary | ICD-10-CM | POA: Insufficient documentation

## 2016-07-30 DIAGNOSIS — F10120 Alcohol abuse with intoxication, uncomplicated: Secondary | ICD-10-CM | POA: Insufficient documentation

## 2016-07-30 LAB — CBC WITH DIFFERENTIAL/PLATELET
BASOS ABS: 0.1 10*3/uL (ref 0–0.1)
BASOS ABS: 0 10*3/uL (ref 0–0.1)
BASOS PCT: 0 %
Basophils Relative: 2 %
EOS ABS: 0.1 10*3/uL (ref 0–0.7)
EOS PCT: 2 %
EOS PCT: 1 %
Eosinophils Absolute: 0.1 10*3/uL (ref 0–0.7)
HCT: 37.3 % — ABNORMAL LOW (ref 40.0–52.0)
HCT: 33.8 % — ABNORMAL LOW (ref 40.0–52.0)
Hemoglobin: 12.6 g/dL — ABNORMAL LOW (ref 13.0–18.0)
Hemoglobin: 11.2 g/dL — ABNORMAL LOW (ref 13.0–18.0)
LYMPHS PCT: 25 %
Lymphocytes Relative: 13 %
Lymphs Abs: 1 10*3/uL (ref 1.0–3.6)
Lymphs Abs: 1 10*3/uL (ref 1.0–3.6)
MCH: 27.1 pg (ref 26.0–34.0)
MCH: 27.3 pg (ref 26.0–34.0)
MCHC: 33.9 g/dL (ref 32.0–36.0)
MCHC: 33.2 g/dL (ref 32.0–36.0)
MCV: 80.2 fL (ref 80.0–100.0)
MCV: 82.4 fL (ref 80.0–100.0)
MONO ABS: 0.8 10*3/uL (ref 0.2–1.0)
Monocytes Absolute: 0.3 10*3/uL (ref 0.2–1.0)
Monocytes Relative: 8 %
Monocytes Relative: 10 %
NEUTROS ABS: 2.6 10*3/uL (ref 1.4–6.5)
NEUTROS ABS: 6 10*3/uL (ref 1.4–6.5)
Neutrophils Relative %: 63 %
Neutrophils Relative %: 76 %
PLATELETS: 228 10*3/uL (ref 150–440)
PLATELETS: 198 10*3/uL (ref 150–440)
RBC: 4.65 MIL/uL (ref 4.40–5.90)
RBC: 4.09 MIL/uL — ABNORMAL LOW (ref 4.40–5.90)
RDW: 15.8 % — ABNORMAL HIGH (ref 11.5–14.5)
RDW: 16.2 % — AB (ref 11.5–14.5)
WBC: 4.1 10*3/uL (ref 3.8–10.6)
WBC: 7.8 10*3/uL (ref 3.8–10.6)

## 2016-07-30 LAB — BASIC METABOLIC PANEL WITH GFR
Anion gap: 9 (ref 5–15)
BUN: 8 mg/dL (ref 6–20)
CO2: 25 mmol/L (ref 22–32)
Calcium: 8.3 mg/dL — ABNORMAL LOW (ref 8.9–10.3)
Chloride: 108 mmol/L (ref 101–111)
Creatinine, Ser: 0.89 mg/dL (ref 0.61–1.24)
GFR calc Af Amer: >60 mL/min
GFR calc non Af Amer: >60 mL/min
Glucose, Bld: 102 mg/dL — ABNORMAL HIGH (ref 65–99)
Potassium: 3.8 mmol/L (ref 3.5–5.1)
Sodium: 142 mmol/L (ref 135–145)

## 2016-07-30 LAB — TYPE AND SCREEN
ABO/RH(D): AB POS
ANTIBODY SCREEN: NEGATIVE

## 2016-07-30 LAB — ETHANOL: Alcohol, Ethyl (B): 373 mg/dL

## 2016-07-30 MED ORDER — LIDOCAINE-EPINEPHRINE (PF) 2 %-1:200000 IJ SOLN
10.0000 mL | Freq: Once | INTRAMUSCULAR | Status: DC
  Filled 2016-07-30: qty 10

## 2016-07-30 MED ORDER — SODIUM CHLORIDE 0.9 % IV BOLUS (SEPSIS)
1000.0000 mL | Freq: Once | INTRAVENOUS | Status: AC
  Administered 2016-07-30: 1000 mL via INTRAVENOUS

## 2016-07-30 MED ORDER — LIDOCAINE HCL (PF) 1 % IJ SOLN
INTRAMUSCULAR | Status: AC
  Administered 2016-07-30: 03:00:00
  Filled 2016-07-30: qty 15

## 2016-07-30 MED ORDER — TETANUS-DIPHTH-ACELL PERTUSSIS 5-2.5-18.5 LF-MCG/0.5 IM SUSP
0.5000 mL | Freq: Once | INTRAMUSCULAR | Status: AC
  Administered 2016-07-30: 0.5 mL via INTRAMUSCULAR
  Filled 2016-07-30: qty 0.5
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
## 2016-07-30 NOTE — ED Notes (Signed)
Pt awake at this time. Drinking Gingerale. Significant other at bedside.

## 2016-07-30 NOTE — ED Notes (Signed)
Pt resting comfortably. Significant other at bedside. NAD. Vitals stable.

## 2016-07-30 NOTE — ED Provider Notes (Signed)
-----------------------------------------   11:19 AM on 07/30/2016 -----------------------------------------  Patient is now awake alert, oriented. He has ambulated to the restroom without difficulty. He has eaten without difficulty. Patient will be discharged from the emergency department with his friend who is here in the emergency department with him. Discussed with the patient absorbing sutures in the mouth however he has several Prolene stitches on his face which will need to be removed in approximately 5 days. Patient is agreeable to this plan.   Minna AntisKevin Maddix Heinz, MD 07/30/16 [phone]

## 2016-08-03 ENCOUNTER — Ambulatory Visit: Payer: Self-pay | Admitting: Family Medicine

## 2016-08-03 ENCOUNTER — Emergency Department
Admission: EM | Admit: 2016-08-03 | Discharge: 2016-08-03 | Disposition: A | Discharge status: Home / Self Care | Payer: Self-pay | Attending: Student in an Organized Health Care Education/Training Program | Admitting: Student in an Organized Health Care Education/Training Program

## 2016-08-03 ENCOUNTER — Encounter: Payer: Self-pay | Admitting: Emergency Medicine

## 2016-08-03 VITALS — BP 127/82 | HR 91 | Temp 98.0°F | Ht 69.0 in | Wt 175.0 lb

## 2016-08-03 DIAGNOSIS — I82403 Acute embolism and thrombosis of unspecified deep veins of lower extremity, bilateral: Secondary | ICD-10-CM | POA: Insufficient documentation

## 2016-08-03 DIAGNOSIS — R0602 Shortness of breath: Secondary | ICD-10-CM

## 2016-08-03 DIAGNOSIS — Z4802 Encounter for removal of sutures: Secondary | ICD-10-CM | POA: Insufficient documentation

## 2016-08-03 MED ORDER — APIXABAN 5 MG PO TABS: 5.0000 mg | ORAL_TABLET | Freq: Two times a day (BID) | ORAL | 1 refills | Status: DC

## 2016-08-03 NOTE — ED Provider Notes (Signed)
[facility] Emergency Department Provider Note  ____________________________________________  Time seen: 12:11 PM  I have reviewed the triage vital signs and the nursing notes.   HISTORY  Chief Complaint Suture / Staple Removal   HPI [name] is a 45 y.o. male who presents to the emergency department for suture removal. Sutures were insertedhere on 07/30/2016. He denies complaints today.   Past Medical History:  Diagnosis Date  . Deep vein thrombosis (DVT) (HCC)     There are no active problems to display for this patient.   History reviewed. No pertinent surgical history.  Current Outpatient Rx  . Order #: [id number] Class: Print    Allergies Patient has no known allergies.  No family history on file.  Social History Social History  Substance Use Topics  . Smoking status: Never Smoker  . Smokeless tobacco: Never Used  . Alcohol use Yes    Review of Systems  Constitutional: Denies fever.  HEENT: No change from baseline Respiratory: No cough or shortness of breath Musculoskeletal: No pain. Skin: healing wound; pain gradually resolving.  ____________________________________________   PHYSICAL EXAM:  VITAL SIGNS: ED Triage Vitals  Enc Vitals Group     BP 08/03/16 1153 136/83     Pulse Rate 08/03/16 1153 98     Resp 08/03/16 1153 18     Temp 08/03/16 1153 98.4 F (36.9 C)     Temp Source 08/03/16 1153 Oral     SpO2 08/03/16 1153 95 %     Weight 08/03/16 1151 179 lb (81.2 kg)     Height 08/03/16 1151 5\' 8"  (1.727 m)     Head Circumference --      Peak Flow --      Pain Score 08/03/16 1152 6     Pain Loc --      Pain Edu? --      Excl. in GC? --      Constitutional: Appears well. No distress HEENT: Atraumtaic, normal appearance, EOMI, sclera normal, voice normal. Respiratory: Respirations even and unlabored.  Cardiovascular: Capillary refill normal. Peripheral pulses 2+ Musculoskeletal: Full ROM x 4. Skin: Wound  edges well approximated, sutures still in place on left eyebrow and right upper lip. No evidence of infection or cellulitis. Neurovascular: Gait steady; Alert and oriented x 4.   PROCEDURES  Procedure(s) performed: SUTURE REMOVAL Performed by:   Consent: Verbal consent obtained. Patient identity confirmed: provided demographic data Time out: Immediately prior to procedure a "time out" was called to verify the correct patient, procedure, equipment, support staff and site/side marked as required.  Location details: Left eyebrow and right upper lip  Wound Appearance: clean  Sutures/Staples Removed: Are in   Facility: sutures placed in this facility  Patient tolerance: Patient tolerated the procedure well with no immediate complications.    ____________________________________________   INITIAL IMPRESSION / ASSESSMENT AND PLAN / ED COURSE  Pertinent labs & imaging results that were available during my care of the patient were reviewed by me and considered in my medical decision making (see chart for details).  Wound care discussed. Patient advised to keep covered with sunscreen. Patient was advised to return to the ER for symptoms that change or worsen if unable to schedule an appointment with primary care.  ____________________________________________   FINAL CLINICAL IMPRESSION(S) / ED DIAGNOSES  Final diagnoses:  Visit for suture removal      Jeremy PesterCari B Jaylyn Iyer, FNP 08/03/16 1213    Willy EddyPatrick Robinson, MD 08/03/16 1457
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
## 2016-08-03 NOTE — ED Notes (Signed)
Patient with stiches to right side of upper lip and above left eyebrow. Reports minimal drainage from lip . No redness or drainage noted upon assessment.

## 2016-08-03 NOTE — ED Triage Notes (Signed)
Patient presents to the ED to have sutures removed from his right eyebrow.  Patient is in no obvious distress at this time.

## 2016-08-03 NOTE — Patient Instructions (Addendum)
Deep Vein Thrombosis A deep vein thrombosis (DVT) is a blood clot (thrombus) that usually occurs in a deep, larger vein of the lower leg or the pelvis, or in an upper extremity such as the arm. These are dangerous and can lead to serious and even life-threatening complications if the clot travels to the lungs. A DVT can damage the valves in your leg veins so that instead of flowing upward, the blood pools in the lower leg. This is called post-thrombotic syndrome, and it can result in pain, swelling, discoloration, and sores on the leg. What are the causes? A DVT is caused by the formation of a blood clot in your leg, pelvis, or arm. Usually, several things contribute to the formation of blood clots. A clot may develop when:  Your blood flow slows down.  Your vein becomes damaged in some way.  You have a condition that makes your blood clot more easily. What increases the risk? A DVT is more likely to develop in:  People who are older, especially over 57 years of age.  People who are overweight (obese).  People who sit or lie still for a long time, such as during long-distance travel (over 4 hours), bed rest, hospitalization, or during recovery from certain medical conditions like a stroke.  People who do not engage in much physical activity (sedentary lifestyle).  People who have chronic breathing disorders.  People who have a personal or family history of blood clots or blood clotting disease.  People who have peripheral vascular disease (PVD), diabetes, or some types of cancer.  People who have heart disease, especially if the person had a recent heart attack or has congestive heart failure.  People who have neurological diseases that affect the legs (leg paresis).  People who have had a traumatic injury, such as breaking a hip or leg.  People who have recently had major or lengthy surgery, especially on the hip, knee, or abdomen.  People who have had a central line placed  inside a large vein.  People who take medicines that contain the hormone estrogen. These include birth control pills and hormone replacement therapy.  Pregnancy or during childbirth or the postpartum period.  Long plane flights (over 8 hours). What are the signs or symptoms?   Symptoms of a DVT can include:  Swelling of your leg or arm, especially if one side is much worse.  Warmth and redness of your leg or arm, especially if one side is much worse.  Pain in your arm or leg. If the clot is in your leg, symptoms may be more noticeable or worse when you stand or walk.  A feeling of pins and needles, if the clot is in the arm. The symptoms of a DVT that has traveled to the lungs (pulmonary embolism, PE) usually start suddenly and include:  Shortness of breath while active or at rest.  Coughing or coughing up blood or blood-tinged mucus.  Chest pain that is often worse with deep breaths.  Rapid or irregular heartbeat.  Feeling light-headed or dizzy.  Fainting.  Feeling anxious.  Sweating. There may also be pain and swelling in a leg if that is where the blood clot started. These symptoms may represent a serious problem that is an emergency. Do not wait to see if the symptoms will go away. Get medical help right away. Call your local emergency services (911 in the [place] Do not drive yourself to the hospital.  How is this diagnosed? Your health care provider  will take a medical history and perform a physical exam. You may also have other tests, including:  Blood tests to assess the clotting properties of your blood.  Imaging tests, such as CT, ultrasound, MRI, X-ray, and other tests to see if you have clots anywhere in your body. How is this treated? After a DVT is identified, it can be treated. The type of treatment that you receive depends on many factors, such as the cause of your DVT, your risk for bleeding or developing more clots, and other medical conditions that you  have. Sometimes, a combination of treatments is necessary. Treatment options may be combined and include:  Monitoring the blood clot with ultrasound.  Taking medicines by mouth, such as newer blood thinners (anticoagulants), thrombolytics, or warfarin.  Taking anticoagulant medicine by injection or through an IV tube.  Wearing compression stockings or using different types ofdevices.  Surgery (rare) to remove the blood clot or to place a filter in your abdomen to stop the blood clot from traveling to your lungs. Treatments for a DVT are often divided into immediate treatment and long-term treatment (up to 3 months after DVT). You can work with your health care provider to choose the treatment program that is best for you. Follow these instructions at home: If you are taking a newer oral anticoagulant:  Take the medicine every single day at the same time each day.  Understand what foods and drugs interact with this medicine.  Understand that there are no regular blood tests required when using this medicine.  Understand the side effects of this medicine, including excessive bruising or bleeding. Ask your health care provider or pharmacist about other possible side effects. If you are taking warfarin:  Understand how to take warfarin and know which foods can affect how warfarin works in Public relations account executiveyour body.  Understand that it is dangerous to take too much or too little warfarin. Too much warfarin increases the risk of bleeding. Too little warfarin continues to allow the risk for blood clots.  Follow your PT and INR blood testing schedule. The PT and INR results allow your health care provider to adjust your dose of warfarin. It is very important that you have your PT and INR tested as often as told by your health care provider.  Avoid major changes in your diet, or tell your health care provider before you change your diet. Arrange a visit with a registered dietitian to answer your questions.  Many foods, especially foods that are high in vitamin K, can interfere with warfarin and affect the PT and INR results. Eat a consistent amount of foods that are high in vitamin K, such as:  Spinach, kale, broccoli, cabbage, collard greens, turnip greens, Brussels sprouts, peas, cauliflower, seaweed, and parsley.  Beef liver and pork liver.  Green tea.  Soybean oil.  Tell your health care provider about any and all medicines, vitamins, and supplements that you take, including aspirin and other over-the-counter anti-inflammatory medicines. Be especially cautious with aspirin and anti-inflammatory medicines. Do not take those before you ask your health care provider if it is safe to do so. This is important because many medicines can interfere with warfarin and affect the PT and INR results.  Do not start or stop taking any over-the-counter or prescription medicine unless your health care provider or pharmacist tells you to do so. If you take warfarin, you will also need to do these things:  Hold pressure over cuts for longer than usual.  Tell your  dentist and other health care providers that you are taking warfarin before you have any procedures in which bleeding may occur.  Avoid alcohol or drink very small amounts. Tell your health care provider if you change your alcohol intake.  Do not use tobacco products, including cigarettes, chewing tobacco, and e-cigarettes. If you need help quitting, ask your health care provider.  Avoid contact sports. General instructions  Take over-the-counter and prescription medicines only as told by your health care provider. Anticoagulant medicines can have side effects, including easy bruising and difficulty stopping bleeding. If you are prescribed an anticoagulant, you will also need to do these things:  Hold pressure over cuts for longer than usual.  Tell your dentist and other health care providers that you are taking anticoagulants before you have  any procedures in which bleeding may occur.  Avoid contact sports.  Wear a medical alert bracelet or carry a medical alert card that says you have had a PE.  Ask your health care provider how soon you can go back to your normal activities. Stay active to prevent new blood clots from forming.  Make sure to exercise while traveling or when you have been sitting or standing for a long period of time. It is very important to exercise. Exercise your legs by walking or by tightening and relaxing your leg muscles often. Take frequent walks.  Wear compression stockings as told by your health care provider to help prevent more blood clots from forming.  Do not use tobacco products, including cigarettes, chewing tobacco, and e-cigarettes. If you need help quitting, ask your health care provider.  Keep all follow-up appointments with your health care provider. This is important. How is this prevented? Take these actions to decrease your risk of developing another DVT:  Exercise regularly. For at least 30 minutes every day, engage in:  Activity that involves moving your arms and legs.  Activity that encourages good blood flow through your body by increasing your heart rate.  Exercise your arms and legs every hour during long-distance travel (over 4 hours). Drink plenty of water and avoid drinking alcohol while traveling.  Avoid sitting or lying in bed for long periods of time without moving your legs.  Maintain a weight that is appropriate for your height. Ask your health care provider what weight is healthy for you.  If you are a woman who is over 46 years of age, avoid unnecessary use of medicines that contain estrogen. These include birth control pills.  Do not smoke, especially if you take estrogen medicines. If you need help quitting, ask your health care provider. If you are hospitalized, prevention measures may include:  Early walking after surgery, as soon as your health care provider  says that it is safe.  Receiving anticoagulants to prevent blood clots.If you cannot take anticoagulants, other options may be available, such as wearing compression stockings or using different types of devices. Get help right away if:  You have new or increased pain, swelling, or redness in an arm or leg.  You have numbness or tingling in an arm or leg.  You have shortness of breath while active or at rest.  You have chest pain.  You have a rapid or irregular heartbeat.  You feel light-headed or dizzy.  You cough up blood.  You notice blood in your vomit, bowel movement, or urine. These symptoms may represent a serious problem that is an emergency. Do not wait to see if the symptoms will go away. Get  medical help right away. Call your local emergency services (911 in the [place] Do not drive yourself to the hospital.  This information is not intended to replace advice given to you by your health care provider. Make sure you discuss any questions you have with your health care provider. Document Released: 08/14/2005 Document Revised: 01/20/2016 Document Reviewed: 12/09/2014 Elsevier Interactive Patient Education  2017 Else[employer] Apixaban oral tablets What is this medicine? APIXABAN (a PIX a ban) is an anticoagulant (blood thinner). It is used to lower the chance of stroke in people with a medical condition called atrial fibrillation. It is also used to treat or prevent blood clots in the lungs or in the veins. COMMON BRAND NAME(S): Eliquis What should I tell my health care provider before I take this medicine? They need to know if you have any of these conditions: -bleeding disorders -bleeding in the brain -blood in your stools (black or tarry stools) or if you have blood in your vomit -history of stomach bleeding -kidney disease -liver disease -mechanical heart valve -an unusual or allergic reaction to apixaban, other medicines, foods, dyes, or preservatives -pregnant or  trying to get pregnant -breast-feeding How should I use this medicine? Take this medicine by mouth with a glass of water. Follow the directions on the prescription label. You can take it with or without food. If it upsets your stomach, take it with food. Take your medicine at regular intervals. Do not take it more often than directed. Do not stop taking except on your doctor's advice. Stopping this medicine may increase your risk of a blot clot. Be sure to refill your prescription before you run out of medicine. Talk to your pediatrician regarding the use of this medicine in children. Special care may be needed. What if I miss a dose? If you miss a dose, take it as soon as you can. If it is almost time for your next dose, take only that dose. Do not take double or extra doses. What may interact with this medicine? This medicine may interact with the following: -aspirin and aspirin-like medicines -certain medicines for fungal infections like ketoconazole and itraconazole -certain medicines for seizures like carbamazepine and phenytoin -certain medicines that treat or prevent blood clots like warfarin, enoxaparin, and dalteparin -clarithromycin -NSAIDs, medicines for pain and inflammation, like ibuprofen or naproxen -rifampin -ritonavir -St. John's wort What should I watch for while using this medicine? Notify your doctor or health care professional and seek emergency treatment if you develop breathing problems; changes in vision; chest pain; severe, sudden headache; pain, swelling, warmth in the leg; trouble speaking; sudden numbness or weakness of the face, arm, or leg. These can be signs that your condition has gotten worse. If you are going to have surgery, tell your doctor or health care professional that you are taking this medicine. Tell your health care professional that you use this medicine before you have a spinal or epidural procedure. Sometimes people who take this medicine have  bleeding problems around the spine when they have a spinal or epidural procedure. This bleeding is very rare. If you have a spinal or epidural procedure while on this medicine, call your health care professional immediately if you have back pain, numbness or tingling (especially in your legs and feet), muscle weakness, paralysis, or loss of bladder or bowel control. Avoid sports and activities that might cause injury while you are using this medicine. Severe falls or injuries can cause unseen bleeding. Be careful when using sharp tools  or knives. Consider using an Neurosurgeonelectric razor. Take special care brushing or flossing your teeth. Report any injuries, bruising, or red spots on the skin to your doctor or health care professional. What side effects may I notice from receiving this medicine? Side effects that you should report to your doctor or health care professional as soon as possible: -allergic reactions like skin rash, itching or hives, swelling of the face, lips, or tongue -signs and symptoms of bleeding such as bloody or black, tarry stools; red or dark-brown urine; spitting up blood or brown material that looks like coffee grounds; red spots on the skin; unusual bruising or bleeding from the eye, gums, or nose Where should I keep my medicine? Keep out of the reach of children. Store at room temperature between 20 and 25 degrees C (68 and 77 degrees F). Throw away any unused medicine after the expiration date.  2017 Elsevier/Gold Standard (2015-09-16 09:26:49)

## 2016-08-04 MED ORDER — APIXABAN 5 MG PO TABS: 5.0000 mg | ORAL_TABLET | Freq: Two times a day (BID) | ORAL | 1 refills | Status: DC

## 2016-08-04 NOTE — Addendum Note (Signed)
Addended by: Dorcas CarrowJOHNSON, Arial Galligan P on: 08/04/2016 08:54 AM   Modules accepted: Orders

## 2016-08-31 ENCOUNTER — Ambulatory Visit: Payer: Self-pay

## 2016-09-13 ENCOUNTER — Ambulatory Visit: Payer: Self-pay | Admitting: Internal Medicine

## 2016-09-20 ENCOUNTER — Telehealth: Payer: Self-pay

## 2016-09-20 NOTE — Telephone Encounter (Signed)
I received a call from [facility] regarding an appt that has been scheduled for this patient on 10/06/2016 @ 9:30  They requested we contact the patient with the appointment.  Thank You Clydie BraunKaren

## 2016-09-21 NOTE — Telephone Encounter (Signed)
Patient notified

## 2016-09-27 ENCOUNTER — Encounter: Payer: Self-pay | Admitting: Internal Medicine

## 2016-09-27 ENCOUNTER — Ambulatory Visit: Payer: Self-pay | Admitting: Internal Medicine

## 2016-09-27 VITALS — BP 143/90 | HR 86 | Temp 98.1°F | Wt 168.0 lb

## 2016-09-27 DIAGNOSIS — I82513 Chronic embolism and thrombosis of femoral vein, bilateral: Secondary | ICD-10-CM

## 2016-09-27 NOTE — Patient Instructions (Signed)
F/u in 1 month  Do not need Radiology apt that is scheduled for tomorrow  Keep apt with the Hematologist Referral for eye apt

## 2016-09-27 NOTE — Progress Notes (Signed)
   Subjective:    Patient ID: [name], male    DOB: 1971/07/31, 46 y.o.   MRN: [id number]  HPI   Pt has history of DVT of lower extremities.  Pt complains of constant headache for over a month. Reports it is likely due to his vision. He used to have glasses but has not been wearing them.  Pt unaware of scheduled CT Angio Chest for tomorrow.   Patient Active Problem List   Diagnosis Date Noted  . 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 Always use your most recent med list.          apixaban 5 MG Tabs tablet Commonly known as:  ELIQUIS Take 1 tablet (5 mg total) by mouth 2 (two) times daily.         Review of Systems  Referral for eye apt to evaluate relation to headaches    Objective:   Physical Exam  Constitutional: He is oriented to person, place, and time.  Cardiovascular: Normal rate, regular rhythm and normal heart sounds.   Pulmonary/Chest: Effort normal and breath sounds normal.  Neurological: He is alert and oriented to person, place, and time.    BP (!) 143/90   Pulse 86   Temp 98.1 F (36.7 C) (Oral)   Wt 168 lb (76.2 kg)   BMI 24.81 kg/m   No edema in lower extremities. Good pulses.    Assessment & Plan:   F/u in 1 month Pt does not need Radiology apt scheduled for tomorrow.  Keep Hematology apt.  Referral for eye apt.

## 2016-09-28 ENCOUNTER — Ambulatory Visit: Admission: RE | Admit: 2016-09-28 | Payer: Self-pay | Source: Ambulatory Visit

## 2016-10-03 ENCOUNTER — Ambulatory Visit: Payer: Self-pay | Admitting: Pharmacy Technician

## 2016-10-03 NOTE — Progress Notes (Signed)
Patient scheduled for eligibility appointment at [facility].  Patient called to cancel the appointment.  Eligibility Appointment rescheduled to 10/13/16 at 12:00p.m.  Sherilyn DacostaBetty J. Shauntia Levengood Care Manager [facility]

## 2016-10-06 ENCOUNTER — Encounter: Payer: Self-pay | Admitting: Oncology

## 2016-10-06 ENCOUNTER — Inpatient Hospital Stay: Payer: Self-pay | Attending: Oncology | Admitting: Oncology

## 2016-10-06 VITALS — BP 149/94 | HR 82 | Temp 97.2°F | Ht 69.0 in | Wt 170.2 lb

## 2016-10-06 DIAGNOSIS — Z7901 Long term (current) use of anticoagulants: Secondary | ICD-10-CM | POA: Insufficient documentation

## 2016-10-06 DIAGNOSIS — I2782 Chronic pulmonary embolism: Secondary | ICD-10-CM

## 2016-10-06 DIAGNOSIS — I82403 Acute embolism and thrombosis of unspecified deep veins of lower extremity, bilateral: Secondary | ICD-10-CM

## 2016-10-06 DIAGNOSIS — Z86711 Personal history of pulmonary embolism: Secondary | ICD-10-CM | POA: Insufficient documentation

## 2016-10-06 DIAGNOSIS — Z86718 Personal history of other venous thrombosis and embolism: Secondary | ICD-10-CM | POA: Insufficient documentation

## 2016-10-06 NOTE — Progress Notes (Signed)
Patient here for initial follow up. He is extremely nervous about his diagnosis and treatment plan. He was hospitalized in December for DVT has a past medical history of DVT and has a filter in place in his right groin. He is experiencing pain in his groin. Prior to his first DVT he was on ant hypertensive medication which was discontinued. His Blood pressure is elevated today.  Patient also has frequent severe headaches. [facility]

## 2016-10-06 NOTE — Progress Notes (Signed)
Hematology/Oncology Consult note [facility] [phone] [phone]  Patient Care Team: Pcp Not In System as PCP - General   Name of the patient: [name]  [id number]  1971/04/14    Reason for referral- acute DVT   Referring physician- Dr. Olevia PerchesMegan Johnson  Date of visit: 10/06/16   History of presenting illness- 1. Patient is a 46 year old male who has a past history of PE and pulmonary infarction. Patient's first episode of DVT was sometime in the 1996. He thinks he took Coumadin for a few months following that episode and stopped. He was subsequently found to have repeated episodes of DVT and PE since 2010 and was on Coumadin since then. On review of his prior PT/INRs - only on 2 occasions he was therapeutic and all other occasions he has been subtherapeutic. Patient states that he has been compliant with Coumadin in the past. He recently developed bilateral lower extremity DVT and was started on Eliquis in November 2017.   2. He was having bilateral lower extremity pain and edema for several weeks and underwent ultrasound of his bilateral lower extremities on 07/22/2016 which showed bilateral DVTs involving lower extremities both proximal and distal veins  2. Doppler report:  Positive for bilateral deep venous thrombosis involving the right femoral, popliteal, and posterior tibial veins as well as the left profunda femoral, femoral, popliteal, and calf veins.   3. CT abdomen on 09/29/16 showed: IMPRESSION: 1. Intact infrarenal IVC filter as described above. The patency of the inferior vena cava and the presence of any clot in the filter cannot be commented on without intravenous contrast. 2. Partially visualized right pleural thickening with areas of atelectasis and suspected rounded atelectasis in the right middle lobe and right lower lobe. Recommend comparison with any prior outside CTs. 3. Hepatic steatosis. 4. Cholelithiasis. 5.  Punctate nonobstructing right renal calculus.  4. Patient does not have any health insurance and states that he has been [facility]. He does report significant headaches since he started taking Eliquis. Denies any family history of DVT/PE. Does not remember getting any genetic testing done for recurrent DVT.  ECOG PS- 1  Pain scale- 4- headaches   Review of systems- Review of Systems  Constitutional: Negative for chills, fever, malaise/fatigue and weight loss.  HENT: Negative for congestion, ear discharge and nosebleeds.   Eyes: Negative for blurred vision.  Respiratory: Negative for cough, hemoptysis, sputum production, shortness of breath and wheezing.   Cardiovascular: Negative for chest pain, palpitations, orthopnea and claudication.  Gastrointestinal: Negative for abdominal pain, blood in stool, constipation, diarrhea, heartburn, melena, nausea and vomiting.  Genitourinary: Negative for dysuria, flank pain, frequency, hematuria and urgency.  Musculoskeletal: Negative for back pain, joint pain and myalgias.  Skin: Negative for rash.  Neurological: Negative for dizziness, tingling, focal weakness, seizures, weakness and headaches.  Endo/Heme/Allergies: Does not bruise/bleed easily.  Psychiatric/Behavioral: Negative for depression and suicidal ideas. The patient does not have insomnia.     No Known Allergies  Patient Active Problem List   Diagnosis Date Noted  . 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 Deep vein thrombosis (DVT) (HCC)      Past Surgical History:  Procedure Laterality Date  . MANDIBLE SURGERY  1996    Social History   Social History  . Marital status: Single    Spouse name: N/A  . Number  of children: N/A  . Years of education: N/A   Occupational History  . Not on file.   Social History Main Topics  . Smoking status: Never  Smoker  . Smokeless tobacco: Never Used  . Alcohol use 1.8 - 2.4 oz/week    3 - 4 Cans of beer per week     Comment: per day   . Drug use: No  . Sexual activity: Yes    Partners: Female    Birth control/ protection: None   Other Topics Concern  . Not on file   Social History Narrative  . No narrative on file     Family History  Problem Relation Age of Onset  . Heart attack Father      Current Outpatient Prescriptions:  .  apixaban (ELIQUIS) 5 MG TABS tablet, Take 1 tablet (5 mg total) by mouth 2 (two) times daily., Disp: 60 tablet, Rfl: 1   Physical exam:  Vitals:   10/06/16 0933  BP: (!) 149/94  Pulse: 82  Temp: 97.2 F (36.2 C)  TempSrc: Tympanic  Weight: 170 lb 3.1 oz (77.2 kg)  Height: 5\' 9"  (1.753 m)   Physical Exam  Constitutional: He is oriented to person, place, and time and well-developed, well-nourished, and in no distress.  HENT:  Head: Normocephalic and atraumatic.  Eyes: EOM are normal. Pupils are equal, round, and reactive to light.  Neck: Normal range of motion.  Cardiovascular: Normal rate, regular rhythm and normal heart sounds.   Pulmonary/Chest: Effort normal and breath sounds normal.  Abdominal: Soft. Bowel sounds are normal.  Neurological: He is alert and oriented to person, place, and time.  Skin: Skin is warm and dry.       CMP Latest Ref Rng & Units 07/30/2016  Glucose 65 - 99 mg/dL 696(E)  BUN 6 - 20 mg/dL 8  Creatinine 9.52 - 8.41 mg/dL 3.24  Sodium 401 - 027 mmol/L 142  Potassium 3.5 - 5.1 mmol/L 3.8  Chloride 101 - 111 mmol/L 108  CO2 22 - 32 mmol/L 25  Calcium 8.9 - 10.3 mg/dL 8.3(L)  Total Protein 6.4 - 8.2 g/dL -  Total Bilirubin 0.2 - 1.0 mg/dL -  Alkaline Phos Unit/L -  AST 15 - 37 Unit/L -  ALT 12 - 78 U/L -   CBC Latest Ref Rng & Units 07/30/2016  WBC 3.8 - 10.6 K/uL 7.8  Hemoglobin 13.0 - 18.0 g/dL 11.2(L)  Hematocrit 40.0 - 52.0 % 33.8(L)  Platelets 150 - 440 K/uL 198     Assessment and plan- Patient is a  46 y.o. male with a history of recurrent DVT and PE in the past and most recently found to have bilateral lower extremity DVT  1. Patient does have some hypercoagulable disorder given that he has had recurrent DVTs and he is in the past and needs lifelong anticoagulation. Currently patient does not have any medical insurance and not sure if genetic testing suggest active 5 Leiden mutation, prothrombin gene mutation and antiphospholipid antibody testing is something that he would be able to afford. Testing for other disorders such as protein C or protein S deficiency would be difficult to interpret in the face of acute DVT and while on anticoagulation. Genetic testing at this time would not change management given that he needs lifelong anticoagulation  2. It is unclear if the patient was truly compliant with Coumadin as repeated INR checks in the past have all been subtherapeutic except on 2 or 3 occasions. Even on  the day he got his bilateral lower extremity DVT in November 2017 his INR was subtherapeutic. If patient were to go back on Coumadin we will have to follow a consistent diet and also be compliant with his dosing as well as INR checks. Hence at this point it would be better for him to continue Eliquis since he has been [facility]. I explained to him that there is no monitoring is required for Eliquis but at the same time there is no FDA approved reversal agent for the same.    3. it is unclear if patient's headaches are secondary to Eliquis as Eliquis is not known to have that side effect.  patient will be going through ophthalmic evaluation to see if he needs reading glasses that is contribution into his headaches. If he has persistent headaches and no other etiology is evident who would consider switching him from Eliquis and other oral agents such as xarelto or pradaxa provided he can get [facility].   4. Patient also has an IVC filter in place since  2010. I explained to him that chronic IVC filter in itself service a nidus for further blood clots. But it would be indicated if he were to have recurrent DVT and PE while on anticoagulation or have any bleeding issues that would preclude him from getting anticoagulation. At this time I would not like to interrupt his anticoagulation given his recent DVT. I will discuss this further when I see him back in 3 months time.    Total face to face encounter time for this patient visit was 45 min. >50% of the time was  spent in counseling and coordination of care.     Thank you for this kind referral and the opportunity to participate in the care of this patient   Visit Diagnosis 1. 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 Other chronic pulmonary embolism without acute cor pulmonale (HCC)     Dr. Owens Shark, MD, MPH [facility] at [facility] Pager- [phone] 10/06/2016 2:26 PM

## 2016-10-13 ENCOUNTER — Ambulatory Visit: Payer: Self-pay | Admitting: Pharmacy Technician

## 2016-10-13 DIAGNOSIS — Z79899 Other long term (current) drug therapy: Secondary | ICD-10-CM

## 2016-10-13 NOTE — Progress Notes (Signed)
Met with patient completed financial assistance application for [facility] due to recent hospital visit.  Patient agreed to be responsible for gathering financial information and forwarding to appropriate department in [facility].    Completed [facility] application and contract.  Patient agreed to all terms of the [facility] contract.  Patient to provide notarized letter of support.  Provided patient with Civil engineer, contracting based on his particular needs.    Eliquis Prescription Application completed with patient.  Forwarded to [employer] for signature.  Upon receipt of signed application from provider and proof of income from patient, Eliquis Prescription Application will be submitted to [facility].  [facility]

## 2016-10-16 ENCOUNTER — Ambulatory Visit: Payer: Self-pay

## 2016-10-17 ENCOUNTER — Other Ambulatory Visit: Payer: Self-pay

## 2016-10-17 NOTE — Telephone Encounter (Signed)
Received PAP application from [facility] for Eliquis placed for provider to sign. 

## 2016-10-25 ENCOUNTER — Ambulatory Visit: Payer: Self-pay | Admitting: Internal Medicine

## 2016-10-25 ENCOUNTER — Ambulatory Visit: Payer: Self-pay | Admitting: Ophthalmology

## 2016-10-25 VITALS — BP 123/85 | HR 84 | Temp 97.8°F | Wt 169.0 lb

## 2016-10-25 DIAGNOSIS — I82513 Chronic embolism and thrombosis of femoral vein, bilateral: Secondary | ICD-10-CM

## 2016-10-25 MED ORDER — APIXABAN 5 MG PO TABS: 5.0000 mg | ORAL_TABLET | Freq: Two times a day (BID) | ORAL | 7 refills | Status: DC

## 2016-10-25 NOTE — Patient Instructions (Signed)
F/u in 3 months w/ labs 

## 2016-10-25 NOTE — Progress Notes (Signed)
   Subjective:    Patient ID: [name], male    DOB: 1971/05/11, 46 y.o.   MRN: [id number]  HPI   Pt f/u for DVT and med refill. Pt reports headaches have cleared.   Patient Active Problem List   Diagnosis Date Noted  . 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 Always use your most recent med list.          apixaban 5 MG Tabs tablet Commonly known as:  ELIQUIS Take 1 tablet (5 mg total) by mouth 2 (two) times daily.   multivitamin with minerals Tabs tablet Take 1 tablet by mouth daily.        Review of Systems     Objective:   Physical Exam  Constitutional: He is oriented to person, place, and time.  Cardiovascular: Normal rate, regular rhythm and normal heart sounds.   Pulmonary/Chest: Effort normal and breath sounds normal.  Neurological: He is alert and oriented to person, place, and time.    BP 123/85   Pulse 84   Temp 97.8 F (36.6 C) (Oral)   Wt 169 lb (76.7 kg)   BMI 24.96 kg/m   BP is stable.  Headaches have cleared.  Eliquis has been reordered and will be available for Pt.      Assessment & Plan:   F/u in 3 months w/ labs: Met C, CBC, UA

## 2016-10-30 ENCOUNTER — Telehealth: Payer: Self-pay | Admitting: Pharmacy Technician

## 2016-10-30 NOTE — Telephone Encounter (Signed)
[facility] through 08/27/2017, as long as patient's income does not exceed 250% FPL or pt does not obtain prescription coverage.  Sherilyn DacostaBetty J. Williom Cedar Care Manager [facility]

## 2016-10-31 ENCOUNTER — Telehealth: Payer: Self-pay | Admitting: Pharmacist

## 2016-10-31 NOTE — Telephone Encounter (Signed)
Faxed application to [facility] for Eliquis 5mg  to process.

## 2017-01-04 ENCOUNTER — Inpatient Hospital Stay: Payer: Self-pay

## 2017-01-04 ENCOUNTER — Inpatient Hospital Stay: Payer: Self-pay | Admitting: Oncology

## 2017-01-17 ENCOUNTER — Other Ambulatory Visit: Payer: Self-pay

## 2017-01-17 DIAGNOSIS — I82513 Chronic embolism and thrombosis of femoral vein, bilateral: Secondary | ICD-10-CM

## 2017-01-18 LAB — COMPREHENSIVE METABOLIC PANEL
A/G RATIO: 1.4 (ref 1.2–2.2)
ALK PHOS: 67 IU/L (ref 39–117)
ALT: 17 IU/L (ref 0–44)
AST: 31 IU/L (ref 0–40)
Albumin: 4.2 g/dL (ref 3.5–5.5)
BILIRUBIN TOTAL: 0.4 mg/dL (ref 0.0–1.2)
BUN/Creatinine Ratio: 10 (ref 9–20)
BUN: 10 mg/dL (ref 6–24)
CHLORIDE: 104 mmol/L (ref 96–106)
CO2: 25 mmol/L (ref 18–29)
Calcium: 9.1 mg/dL (ref 8.7–10.2)
Creatinine, Ser: 1.05 mg/dL (ref 0.76–1.27)
GFR calc non Af Amer: 84 mL/min/{1.73_m2} (ref >59)
GFR, EST AFRICAN AMERICAN: 97 mL/min/{1.73_m2} (ref >59)
GLUCOSE: 82 mg/dL (ref 65–99)
Globulin, Total: 3.1 g/dL (ref 1.5–4.5)
POTASSIUM: 4.4 mmol/L (ref 3.5–5.2)
Sodium: 145 mmol/L — ABNORMAL HIGH (ref 134–144)
TOTAL PROTEIN: 7.3 g/dL (ref 6.0–8.5)

## 2017-01-18 LAB — CBC
Hematocrit: 45 % (ref 37.5–51.0)
Hemoglobin: 14.6 g/dL (ref 13.0–17.7)
MCH: 25.9 pg — AB (ref 26.6–33.0)
MCHC: 32.4 g/dL (ref 31.5–35.7)
MCV: 80 fL (ref 79–97)
PLATELETS: 228 10*3/uL (ref 150–379)
RBC: 5.63 x10E6/uL (ref 4.14–5.80)
RDW: 16.1 % — AB (ref 12.3–15.4)
WBC: 2.7 10*3/uL — ABNORMAL LOW (ref 3.4–10.8)

## 2017-01-18 LAB — URINALYSIS
BILIRUBIN UA: NEGATIVE
GLUCOSE, UA: NEGATIVE
Leukocytes, UA: NEGATIVE
NITRITE UA: NEGATIVE
RBC UA: NEGATIVE
SPEC GRAV UA: 1.024 (ref 1.005–1.030)
UUROB: 0.2 mg/dL (ref 0.2–1.0)
pH, UA: 5 (ref 5.0–7.5)

## 2017-01-24 ENCOUNTER — Ambulatory Visit: Payer: Self-pay | Admitting: Internal Medicine

## 2017-02-14 ENCOUNTER — Telehealth: Payer: Self-pay

## 2017-02-14 ENCOUNTER — Encounter: Payer: Self-pay | Admitting: Internal Medicine

## 2017-02-14 ENCOUNTER — Ambulatory Visit: Payer: Self-pay | Admitting: Internal Medicine

## 2017-02-14 VITALS — BP 144/91 | HR 83 | Temp 97.9°F | Resp 16 | Wt 168.3 lb

## 2017-02-14 DIAGNOSIS — I2699 Other pulmonary embolism without acute cor pulmonale: Secondary | ICD-10-CM

## 2017-02-14 MED ORDER — APIXABAN 5 MG PO TABS: 5.0000 mg | ORAL_TABLET | Freq: Two times a day (BID) | ORAL | 7 refills | Status: AC

## 2017-02-14 NOTE — Progress Notes (Signed)
Subjective:    Patient ID: [name], male    DOB: 03-07-1971, 46 y.o.   MRN: [id number]  HPI   Pt is here for f/u Pt concerned about hereditary blood clotting condition      Patient Active Problem List   Diagnosis Date Noted  . 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 Always use your most recent med list.          apixaban 5 MG Tabs tablet Commonly known as:  ELIQUIS Take 1 tablet (5 mg total) by mouth 2 (two) times daily.   multivitamin with minerals Tabs tablet Take 1 tablet by mouth daily.         Review of Systems     Objective:   Physical Exam  Constitutional: He is oriented to person, place, and time.  Cardiovascular: Normal rate, regular rhythm and normal heart sounds.   Pulmonary/Chest: Effort normal and breath sounds normal.  Neurological: He is alert and oriented to person, place, and time.     BP (!) 144/91 (BP Location: Left Arm, Cuff Size: Normal)   Pulse 83   Temp 97.9 F (36.6 C)   Resp 16   Wt 168 lb 4.8 oz (76.3 kg)   BMI 24.85 kg/m         Assessment & Plan:   Referral to [facility]:  Fax- [phone] & Phone- [phone]    F/u in 6 mo w/ labs: CBC, Met C, UA, Lipid

## 2017-02-14 NOTE — Telephone Encounter (Signed)
[facility]

## 2017-02-15 ENCOUNTER — Telehealth: Payer: Self-pay | Admitting: Pharmacist

## 2017-02-15 NOTE — Telephone Encounter (Signed)
02/15/17 Called [name] for refill on Eliquis 5mg  (take 1 two times a day).

## 2017-07-18 ENCOUNTER — Telehealth: Payer: Self-pay

## 2017-07-18 NOTE — Telephone Encounter (Signed)
[facility] placed for provider to sign.

## 2017-07-18 NOTE — Telephone Encounter (Signed)
Placed signed application/script in [facility] folder for pickup. 

## 2017-07-24 ENCOUNTER — Emergency Department (HOSPITAL_COMMUNITY)
Admission: EM | Admit: 2017-07-24 | Discharge: 2017-07-24 | Disposition: A | Discharge status: Home / Self Care | Attending: Emergency Medicine | Admitting: Emergency Medicine

## 2017-07-24 ENCOUNTER — Encounter (HOSPITAL_COMMUNITY): Payer: Self-pay | Admitting: *Deleted

## 2017-07-24 DIAGNOSIS — Z86711 Personal history of pulmonary embolism: Secondary | ICD-10-CM | POA: Insufficient documentation

## 2017-07-24 DIAGNOSIS — Z7901 Long term (current) use of anticoagulants: Secondary | ICD-10-CM | POA: Diagnosis not present

## 2017-07-24 DIAGNOSIS — M79604 Pain in right leg: Secondary | ICD-10-CM | POA: Insufficient documentation

## 2017-07-24 DIAGNOSIS — I10 Essential (primary) hypertension: Secondary | ICD-10-CM | POA: Insufficient documentation

## 2017-07-24 DIAGNOSIS — Z79899 Other long term (current) drug therapy: Secondary | ICD-10-CM | POA: Diagnosis not present

## 2017-07-24 HISTORY — DX: Essential (primary) hypertension: I10

## 2017-07-24 HISTORY — DX: Other pulmonary embolism without acute cor pulmonale: I26.99

## 2017-07-24 LAB — BASIC METABOLIC PANEL
ANION GAP: 7 (ref 5–15)
BUN: 16 mg/dL (ref 6–20)
CO2: 29 mmol/L (ref 22–32)
Calcium: 9.2 mg/dL (ref 8.9–10.3)
Chloride: 103 mmol/L (ref 101–111)
Creatinine, Ser: 0.89 mg/dL (ref 0.61–1.24)
GFR calc Af Amer: >60 mL/min (ref >60)
Glucose, Bld: 79 mg/dL (ref 65–99)
POTASSIUM: 4 mmol/L (ref 3.5–5.1)
SODIUM: 139 mmol/L (ref 135–145)

## 2017-07-24 LAB — CBC
HCT: 48.2 % (ref 39.0–52.0)
HEMOGLOBIN: 15.2 g/dL (ref 13.0–17.0)
MCH: 25.9 pg — ABNORMAL LOW (ref 26.0–34.0)
MCHC: 31.5 g/dL (ref 30.0–36.0)
MCV: 82.3 fL (ref 78.0–100.0)
PLATELETS: 217 10*3/uL (ref 150–400)
RBC: 5.86 MIL/uL — AB (ref 4.22–5.81)
RDW: 13.8 % (ref 11.5–15.5)
WBC: 4.7 10*3/uL (ref 4.0–10.5)

## 2017-07-24 MED ORDER — APIXABAN 5 MG PO TABS
5.0000 mg | ORAL_TABLET | Freq: Once | ORAL | Status: AC
  Administered 2017-07-24: 5 mg via ORAL
  Filled 2017-07-24 (×2): qty 1

## 2017-07-24 NOTE — ED Provider Notes (Signed)
[facility] EMERGENCY DEPARTMENT Provider Note   CSN: [id number] Arrival date & time: 07/24/17  1835     History   Chief Complaint Chief Complaint  Patient presents with  . Leg Pain    HPI [name] is a 46 y.o. male.  HPI Patient presents to the emergency room for evaluation of right leg cramping and pain.  Patient has history of a PE and DVT.  He has an IVC filter in place.  Patient is on Eliquis.  He took his morning dose but has not taken his evening dose.  Patient is currently incarcerated.  Is brought to the emergency room for evaluation because of his leg pain complaints.  He denies any fevers or chills.  No recent injuries.  No chest pain or shortness of breath. Past Medical History:  Diagnosis Date  . Deep vein thrombosis (DVT) (HCC)   . Hypertension   . Pulmonary embolism [facility](HCC)     Patient Active Problem List   Diagnosis Date Noted  . 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 MANDIBLE SURGERY  1996       Home Medications    Prior to Admission medications   Medication Sig Start Date End Date Taking? Authorizing Provider  apixaban (ELIQUIS) 5 MG TABS tablet Take 1 tablet (5 mg total) by mouth 2 (two) times daily. 02/14/17   Virl Axehaplin, Don C, MD  Multiple Vitamin (MULTIVITAMIN WITH MINERALS) TABS tablet Take 1 tablet by mouth daily.    [provider]    Family History Family History  Problem Relation Age of Onset  . Heart attack Father     Social History Social History   Tobacco Use  . Smoking status: Never Smoker  . Smokeless tobacco: Never Used  Substance Use Topics  . Alcohol use: Yes    Alcohol/week: 1.8 - 2.4 oz    Types: 3 - 4 Cans of beer per week    Comment: per day   . Drug use: No     Allergies   Patient has no known allergies.   Review of Systems Review of Systems  All other systems reviewed and are  negative.    Physical Exam Updated Vital Signs BP (!) 169/91 (BP Location: Right Arm)   Pulse 65   Temp (!) 97.3 F (36.3 C) (Temporal)   Resp 18   Ht 1.727 m (5\' 8" )   Wt 81.6 kg (180 lb)   SpO2 98%   BMI 27.37 kg/m   Physical Exam  Constitutional: He appears well-developed and well-nourished. No distress.  HENT:  Head: Normocephalic and atraumatic.  Right Ear: External ear normal.  Left Ear: External ear normal.  Eyes: Conjunctivae are normal. Right eye exhibits no discharge. Left eye exhibits no discharge. No scleral icterus.  Neck: Neck supple. No tracheal deviation present.  Cardiovascular: Normal rate, regular rhythm and intact distal pulses.  Pulmonary/Chest: Effort normal and breath sounds normal. No stridor. No respiratory distress. He has no wheezes. He has no rales.  Abdominal: Soft. Bowel sounds are normal. He exhibits no distension. There is no tenderness. There is no rebound and no guarding.  Musculoskeletal: He exhibits no edema.  Mild tenderness to palpation of the right calf, varicose veins noted on exam, no lymphedema, no IMA  Neurological: He is alert. He has normal strength. No cranial nerve deficit (no facial droop, extraocular movements intact, no slurred speech) or  sensory deficit. He exhibits normal muscle tone. He displays no seizure activity. Coordination normal.  Skin: Skin is warm and dry. No rash noted.  Psychiatric: He has a normal mood and affect.  Nursing note and vitals reviewed.    ED Treatments / Results  Labs (all labs ordered are listed, but only abnormal results are displayed) Labs Reviewed  CBC - Abnormal; Notable for the following components:      Result Value   RBC 5.86 (*)    MCH 25.9 (*)    All other components within normal limits  BASIC METABOLIC PANEL    Radiology No results found.  Procedures Procedures (including critical care time)  Medications Ordered in ED Medications  apixaban (ELIQUIS) tablet 5 mg (not  administered)     Initial Impression / Assessment and Plan / ED Course  I have reviewed the triage vital signs and the nursing notes.  Pertinent labs & imaging results that were available during my care of the patient were reviewed by me and considered in my medical decision making (see chart for details).   Patient presented to the emergency room for evaluation of right lower extremity pain.  He is currently on anticoagulation.  Patient is not tachypneic or tachycardic.  He is not having any chest pain or shortness of breath.  I doubt acute PE.  Overall I have a low suspicion for recurrent DVT based on exam and the fact that he is on anticoagulation but I will have him return tomorrow to have a Doppler study.  Unfortunately ultrasound is not available this evening to have it performed tonight. Final Clinical Impressions(s) / ED Diagnoses   Final diagnoses:  Right leg pain    ED Discharge Orders        Ordered    US Venous Img Lower Unilateral Right     07/24/17 2059       Linwood DibblesKnapp, Latressa Harries, MD 07/24/17 2101

## 2017-07-24 NOTE — Discharge Instructions (Signed)
Follow-up tomorrow to have the ultrasound performed of your lower leg.  Continue your current medications

## 2017-07-24 NOTE — ED Triage Notes (Signed)
Pt with right leg pain for 3 days, pt with hx of PE and DVT, pt with "cooks filter" that has been recalled, per pt still in place.  Pt takes Eliquis.

## 2017-07-25 ENCOUNTER — Telehealth: Payer: Self-pay | Admitting: Pharmacist

## 2017-07-25 NOTE — Telephone Encounter (Signed)
07/25/17 Faxed script to [facility] [employer] for Eliquis 5mg  Take 1 tablet by mouth two times daily as requested by them per correspondence received.Forde RadonAJ

## 2017-08-15 ENCOUNTER — Other Ambulatory Visit: Payer: Self-pay

## 2017-08-22 ENCOUNTER — Telehealth: Payer: Self-pay | Admitting: Pharmacy Technician

## 2017-08-22 NOTE — Telephone Encounter (Signed)
Pt eligible for pharmacy benefits through Medicaid.  [facility] unable to provide medication assistance.  Sherilyn DacostaBetty J. Elah Avellino Care Manager [facility]

## 2017-09-05 ENCOUNTER — Ambulatory Visit: Payer: Self-pay | Admitting: Internal Medicine

## 2018-02-22 ENCOUNTER — Encounter: Payer: Self-pay | Admitting: *Deleted

## 2018-02-22 ENCOUNTER — Other Ambulatory Visit: Payer: Self-pay

## 2018-02-22 ENCOUNTER — Emergency Department
Admission: EM | Admit: 2018-02-22 | Discharge: 2018-02-22 | Disposition: A | Discharge status: Home / Self Care | Attending: Emergency Medicine | Admitting: Emergency Medicine

## 2018-02-22 DIAGNOSIS — S01111A Laceration without foreign body of right eyelid and periocular area, initial encounter: Secondary | ICD-10-CM | POA: Diagnosis present

## 2018-02-22 DIAGNOSIS — I10 Essential (primary) hypertension: Secondary | ICD-10-CM | POA: Diagnosis not present

## 2018-02-22 DIAGNOSIS — Y9302 Activity, running: Secondary | ICD-10-CM | POA: Insufficient documentation

## 2018-02-22 DIAGNOSIS — Y999 Unspecified external cause status: Secondary | ICD-10-CM | POA: Insufficient documentation

## 2018-02-22 DIAGNOSIS — Y929 Unspecified place or not applicable: Secondary | ICD-10-CM | POA: Insufficient documentation

## 2018-02-22 DIAGNOSIS — S0181XA Laceration without foreign body of other part of head, initial encounter: Secondary | ICD-10-CM

## 2018-02-22 DIAGNOSIS — Z23 Encounter for immunization: Secondary | ICD-10-CM | POA: Insufficient documentation

## 2018-02-22 DIAGNOSIS — W010XXA Fall on same level from slipping, tripping and stumbling without subsequent striking against object, initial encounter: Secondary | ICD-10-CM | POA: Insufficient documentation

## 2018-02-22 MED ORDER — LIDOCAINE-EPINEPHRINE-TETRACAINE (LET) SOLUTION
3.0000 mL | Freq: Once | NASAL | Status: AC
  Administered 2018-02-22: 3 mL via TOPICAL
  Filled 2018-02-22: qty 3

## 2018-02-22 MED ORDER — TETANUS-DIPHTH-ACELL PERTUSSIS 5-2.5-18.5 LF-MCG/0.5 IM SUSP
0.5000 mL | Freq: Once | INTRAMUSCULAR | Status: AC
  Administered 2018-02-22: 0.5 mL via INTRAMUSCULAR
  Filled 2018-02-22: qty 0.5

## 2018-02-22 MED ORDER — LIDOCAINE HCL (PF) 1 % IJ SOLN
5.0000 mL | Freq: Once | INTRAMUSCULAR | Status: AC
  Administered 2018-02-22: 5 mL

## 2018-02-22 NOTE — Discharge Instructions (Addendum)
Keep area clean and dry.  Clean daily with mild soap and water.  Tylenol if needed for pain.  Follow-up at an urgent care or the ED for suture removal in 5 days.

## 2018-02-22 NOTE — ED Provider Notes (Signed)
[facility] Emergency Department Provider Note  ____________________________________________   First MD Initiated Contact with Patient 02/22/18 1213     (approximate)  I have reviewed the triage vital signs and the nursing notes.   HISTORY  Chief Complaint Laceration   HPI [name] is a [age] y.o. male presents to the ED in police custody.  Patient was running from the place when he fell and now has a laceration just above his left eyebrow.  Bleeding is been under control.  There was no loss of consciousness, visual changes, or neurological deficits since this occurred.  Patient rates his pain as 3/10.  He is unaware of his last tetanus but believes it is been over 10 years.   Past Medical History:  Diagnosis Date  . Deep vein thrombosis (DVT) (HCC)   . Hypertension   . Pulmonary embolism [facility](HCC)     Patient Active Problem List   Diagnosis Date Noted  . 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 MANDIBLE SURGERY  1996    Prior to Admission medications   Medication Sig Start Date End Date Taking? Authorizing Provider  apixaban (ELIQUIS) 5 MG TABS tablet Take 1 tablet (5 mg total) by mouth 2 (two) times daily. 02/14/17   Virl Axehaplin, Don C, MD  Multiple Vitamin (MULTIVITAMIN WITH MINERALS) TABS tablet Take 1 tablet by mouth daily.    [provider]    Allergies Patient has no known allergies.  Family History  Problem Relation Age of Onset  . Heart attack Father     Social History Social History   Tobacco Use  . Smoking status: Never Smoker  . Smokeless tobacco: Never Used  Substance Use Topics  . Alcohol use: Yes    Alcohol/week: 1.8 - 2.4 oz    Types: 3 - 4 Cans of beer per week    Comment: per day   . Drug use: No    Review of Systems Constitutional: No fever/chills Eyes: No visual changes. ENT: No  trauma. Cardiovascular: Denies chest pain. Respiratory: Denies shortness of breath. Gastrointestinal:   No nausea, no vomiting.  Musculoskeletal: Negative for back pain. Skin: Positive for laceration. Neurological: Negative for headaches, focal weakness or numbness. ____________________________________________   PHYSICAL EXAM:  VITAL SIGNS: ED Triage Vitals  Enc Vitals Group     BP 02/22/18 1156 103/64     Pulse Rate 02/22/18 1156 (!) 125     Resp 02/22/18 1156 18     Temp 02/22/18 1156 98.9 F (37.2 C)     Temp Source 02/22/18 1156 Oral     SpO2 02/22/18 1156 92 %     Weight 02/22/18 1211 180 lb (81.6 kg)     Height 02/22/18 1211 5\' 8"  (1.727 m)     Head Circumference --      Peak Flow --      Pain Score 02/22/18 1211 3     Pain Loc --      Pain Edu? --      Excl. in GC? --    Constitutional: Alert and oriented. Well appearing and in no acute distress. Eyes: Conjunctivae are normal. PERRL. EOMI. Head: Atraumatic. Nose: No trauma. Mouth/Throat: Mucous membranes are moist.  Oropharynx non-erythematous. Neck: No stridor.   Cardiovascular: Normal rate, regular rhythm. Grossly normal heart sounds.  Good peripheral circulation. Respiratory: Normal respiratory effort.  No retractions. Lungs CTAB. Musculoskeletal: Moves  upper and lower extremities without any difficulty. Neurologic:  Normal speech and language. No gross focal neurologic deficits are appreciated. No gait instability. Skin:  Skin is warm, dry.  Laceration above left eyebrow.  No active bleeding at this time. Psychiatric: Mood and affect are normal. Speech and behavior are normal.  ____________________________________________   LABS (all labs ordered are listed, but only abnormal results are displayed)  Labs Reviewed - No data to display  PROCEDURES  Procedure(s) performed:   Marland KitchenMarland KitchenLaceration Repair Date/Time: 02/22/2018 2:09 PM Performed by: Tommi Rumps, PA-C Authorized by: Tommi Rumps, PA-C    Consent:    Consent obtained:  Verbal   Consent given by:  Patient   Risks discussed:  Poor cosmetic result and poor wound healing Anesthesia (see MAR for exact dosages):    Anesthesia method:  Local infiltration and topical application   Topical anesthetic:  LET   Local anesthetic:  Lidocaine 1% w/o epi Laceration details:    Location:  Face   Face location:  L eyebrow   Length (cm):  2.5 Repair type:    Repair type:  Simple Pre-procedure details:    Preparation:  Patient was prepped and draped in usual sterile fashion Exploration:    Hemostasis achieved with:  LET and direct pressure   Contaminated: no   Treatment:    Area cleansed with:  Saline   Amount of cleaning:  Standard   Irrigation solution:  Sterile saline   Irrigation method:  Syringe   Visualized foreign bodies/material removed: no   Skin repair:    Repair method:  Sutures   Suture size:  6-0   Suture material:  Prolene   Suture technique:  Simple interrupted   Number of sutures:  4 Approximation:    Approximation:  Close Post-procedure details:    Dressing:  Adhesive bandage   Patient tolerance of procedure:  Tolerated well, no immediate complications    Critical Care performed: No  ____________________________________________   INITIAL IMPRESSION / ASSESSMENT AND PLAN / ED COURSE  As part of my medical decision making, I reviewed the following data within the electronic MEDICAL RECORD NUMBER Notes from prior ED visits and [place] Controlled Substance Database  Patient was made aware that laceration instructions would be listed on his discharge papers.  He is to keep area clean and dry and watch for any signs of infection.  Sutures will need to be removed in 5 days.  It is unclear at this time whether he will still be in jail or in custody.  Patient was instructed to take Tylenol if needed.  Patient was discharged in the custody of [place] [employer].  ____________________________________________   FINAL  CLINICAL IMPRESSION(S) / ED DIAGNOSES  Final diagnoses:  Facial laceration, initial encounter     ED Discharge Orders    None       Note:  This document was prepared using Dragon voice recognition software and may include unintentional dictation errors.    Tommi Rumps, PA-C 02/22/18 1412    Dionne Bucy, MD 02/22/18 1424

## 2018-02-22 NOTE — ED Notes (Signed)
Pt advised that he did not know his weight and height

## 2018-02-22 NOTE — ED Triage Notes (Signed)
Pt to ED in police custody. Per police pt was running from a custody warrant and fell and now has a laceration above left eye. Bleeding under control. Laceration is approx. 1 inch in length. No LOC reported no neuro deficits. No other injuries reported or noted at this time.

## 2019-01-30 ENCOUNTER — Other Ambulatory Visit: Payer: Self-pay

## 2019-01-30 ENCOUNTER — Inpatient Hospital Stay (HOSPITAL_COMMUNITY)
Admission: EM | Admit: 2019-01-30 | Discharge: 2019-02-01 | DRG: 379 | Payer: Medicaid Other | Attending: Internal Medicine | Admitting: Internal Medicine

## 2019-01-30 ENCOUNTER — Encounter (HOSPITAL_COMMUNITY): Payer: Self-pay | Admitting: Emergency Medicine

## 2019-01-30 DIAGNOSIS — K3189 Other diseases of stomach and duodenum: Secondary | ICD-10-CM | POA: Diagnosis present

## 2019-01-30 DIAGNOSIS — D649 Anemia, unspecified: Secondary | ICD-10-CM

## 2019-01-30 DIAGNOSIS — D72819 Decreased white blood cell count, unspecified: Secondary | ICD-10-CM | POA: Diagnosis not present

## 2019-01-30 DIAGNOSIS — Z79899 Other long term (current) drug therapy: Secondary | ICD-10-CM | POA: Diagnosis not present

## 2019-01-30 DIAGNOSIS — Z8249 Family history of ischemic heart disease and other diseases of the circulatory system: Secondary | ICD-10-CM

## 2019-01-30 DIAGNOSIS — M79669 Pain in unspecified lower leg: Secondary | ICD-10-CM | POA: Diagnosis present

## 2019-01-30 DIAGNOSIS — K449 Diaphragmatic hernia without obstruction or gangrene: Secondary | ICD-10-CM | POA: Diagnosis present

## 2019-01-30 DIAGNOSIS — Z791 Long term (current) use of non-steroidal anti-inflammatories (NSAID): Secondary | ICD-10-CM

## 2019-01-30 DIAGNOSIS — R001 Bradycardia, unspecified: Secondary | ICD-10-CM | POA: Diagnosis present

## 2019-01-30 DIAGNOSIS — Z86711 Personal history of pulmonary embolism: Secondary | ICD-10-CM | POA: Diagnosis not present

## 2019-01-30 DIAGNOSIS — I82403 Acute embolism and thrombosis of unspecified deep veins of lower extremity, bilateral: Secondary | ICD-10-CM | POA: Diagnosis not present

## 2019-01-30 DIAGNOSIS — Z1159 Encounter for screening for other viral diseases: Secondary | ICD-10-CM | POA: Diagnosis not present

## 2019-01-30 DIAGNOSIS — R740 Nonspecific elevation of levels of transaminase and lactic acid dehydrogenase [LDH]: Secondary | ICD-10-CM | POA: Diagnosis present

## 2019-01-30 DIAGNOSIS — K921 Melena: Principal | ICD-10-CM | POA: Diagnosis present

## 2019-01-30 DIAGNOSIS — R1013 Epigastric pain: Secondary | ICD-10-CM | POA: Diagnosis present

## 2019-01-30 DIAGNOSIS — K922 Gastrointestinal hemorrhage, unspecified: Secondary | ICD-10-CM | POA: Diagnosis present

## 2019-01-30 DIAGNOSIS — R739 Hyperglycemia, unspecified: Secondary | ICD-10-CM | POA: Diagnosis not present

## 2019-01-30 DIAGNOSIS — R1010 Upper abdominal pain, unspecified: Secondary | ICD-10-CM | POA: Insufficient documentation

## 2019-01-30 DIAGNOSIS — R7401 Elevation of levels of liver transaminase levels: Secondary | ICD-10-CM

## 2019-01-30 DIAGNOSIS — I10 Essential (primary) hypertension: Secondary | ICD-10-CM | POA: Diagnosis present

## 2019-01-30 DIAGNOSIS — Z7901 Long term (current) use of anticoagulants: Secondary | ICD-10-CM

## 2019-01-30 DIAGNOSIS — Z86718 Personal history of other venous thrombosis and embolism: Secondary | ICD-10-CM

## 2019-01-30 LAB — HEMOGLOBIN AND HEMATOCRIT, BLOOD
HCT: 32.5 % — ABNORMAL LOW (ref 39.0–52.0)
HCT: 31.8 % — ABNORMAL LOW (ref 39.0–52.0)
Hemoglobin: 10.4 g/dL — ABNORMAL LOW (ref 13.0–17.0)
Hemoglobin: 10.4 g/dL — ABNORMAL LOW (ref 13.0–17.0)

## 2019-01-30 LAB — COMPREHENSIVE METABOLIC PANEL
ALT: 28 U/L (ref 0–44)
AST: 53 U/L — ABNORMAL HIGH (ref 15–41)
Albumin: 4.1 g/dL (ref 3.5–5.0)
Alkaline Phosphatase: 62 U/L (ref 38–126)
Anion gap: 9 (ref 5–15)
BUN: 28 mg/dL — ABNORMAL HIGH (ref 6–20)
CO2: 26 mmol/L (ref 22–32)
Calcium: 9 mg/dL (ref 8.9–10.3)
Chloride: 105 mmol/L (ref 98–111)
Creatinine, Ser: 0.91 mg/dL (ref 0.61–1.24)
GFR calc Af Amer: >60 mL/min (ref >60)
GFR calc non Af Amer: >60 mL/min (ref >60)
Glucose, Bld: 102 mg/dL — ABNORMAL HIGH (ref 70–99)
Potassium: 3.9 mmol/L (ref 3.5–5.1)
Sodium: 140 mmol/L (ref 135–145)
Total Bilirubin: 0.4 mg/dL (ref 0.3–1.2)
Total Protein: 6.9 g/dL (ref 6.5–8.1)

## 2019-01-30 LAB — CBC WITH DIFFERENTIAL/PLATELET
Abs Immature Granulocytes: 0.01 10*3/uL (ref 0.00–0.07)
Basophils Absolute: 0 10*3/uL (ref 0.0–0.1)
Basophils Relative: 1 %
Eosinophils Absolute: 0.1 10*3/uL (ref 0.0–0.5)
Eosinophils Relative: 2 %
HCT: 34.7 % — ABNORMAL LOW (ref 39.0–52.0)
Hemoglobin: 11.1 g/dL — ABNORMAL LOW (ref 13.0–17.0)
Immature Granulocytes: 0 %
Lymphocytes Relative: 24 %
Lymphs Abs: 1 10*3/uL (ref 0.7–4.0)
MCH: 26.9 pg (ref 26.0–34.0)
MCHC: 32 g/dL (ref 30.0–36.0)
MCV: 84 fL (ref 80.0–100.0)
Monocytes Absolute: 0.4 10*3/uL (ref 0.1–1.0)
Monocytes Relative: 10 %
Neutro Abs: 2.5 10*3/uL (ref 1.7–7.7)
Neutrophils Relative %: 63 %
Platelets: 199 10*3/uL (ref 150–400)
RBC: 4.13 MIL/uL — ABNORMAL LOW (ref 4.22–5.81)
RDW: 13.3 % (ref 11.5–15.5)
WBC: 4 10*3/uL (ref 4.0–10.5)
nRBC: 0 % (ref 0.0–0.2)

## 2019-01-30 LAB — TYPE AND SCREEN
ABO/RH(D): AB POS
Antibody Screen: NEGATIVE

## 2019-01-30 LAB — SARS CORONAVIRUS 2 BY RT PCR (HOSPITAL ORDER, PERFORMED IN ~~LOC~~ HOSPITAL LAB): SARS Coronavirus 2: NEGATIVE

## 2019-01-30 LAB — TSH: TSH: 3.022 u[IU]/mL (ref 0.350–4.500)

## 2019-01-30 LAB — POC OCCULT BLOOD, ED: Fecal Occult Bld: POSITIVE — AB

## 2019-01-30 MED ORDER — SODIUM CHLORIDE 0.9 % IV BOLUS
1000.0000 mL | Freq: Once | INTRAVENOUS | Status: AC
  Administered 2019-01-30: 11:00:00 1000 mL via INTRAVENOUS

## 2019-01-30 MED ORDER — SODIUM CHLORIDE 0.9 % IV SOLN: 8.0000 mg/h | INTRAVENOUS | Status: DC

## 2019-01-30 MED ORDER — SODIUM CHLORIDE 0.9 % IV SOLN
INTRAVENOUS | Status: DC
  Administered 2019-01-30 – 2019-01-31 (×3): via INTRAVENOUS

## 2019-01-30 MED ORDER — TRAMADOL HCL 50 MG PO TABS: 50.0000 mg | ORAL_TABLET | Freq: Four times a day (QID) | ORAL | Status: DC | PRN

## 2019-01-30 MED ORDER — ACETAMINOPHEN 650 MG RE SUPP: 650.0000 mg | Freq: Four times a day (QID) | RECTAL | Status: DC | PRN

## 2019-01-30 MED ORDER — ONDANSETRON HCL 4 MG PO TABS: 4.0000 mg | ORAL_TABLET | Freq: Four times a day (QID) | ORAL | Status: DC | PRN

## 2019-01-30 MED ORDER — PANTOPRAZOLE SODIUM 40 MG IV SOLR: 40.0000 mg | Freq: Two times a day (BID) | INTRAVENOUS | Status: DC

## 2019-01-30 MED ORDER — SODIUM CHLORIDE 0.9 % IV SOLN
80.0000 mg | Freq: Once | INTRAVENOUS | Status: AC
  Administered 2019-01-30: 80 mg via INTRAVENOUS
  Filled 2019-01-30: qty 80

## 2019-01-30 MED ORDER — SODIUM CHLORIDE 0.9 % IV SOLN
8.0000 mg/h | INTRAVENOUS | Status: DC
  Administered 2019-01-30 – 2019-02-01 (×3): 8 mg/h via INTRAVENOUS
  Filled 2019-01-30 (×8): qty 80

## 2019-01-30 MED ORDER — ACETAMINOPHEN 325 MG PO TABS: 650.0000 mg | ORAL_TABLET | Freq: Four times a day (QID) | ORAL | Status: DC | PRN

## 2019-01-30 MED ORDER — SENNOSIDES-DOCUSATE SODIUM 8.6-50 MG PO TABS: 1.0000 | ORAL_TABLET | Freq: Every evening | ORAL | Status: DC | PRN

## 2019-01-30 MED ORDER — ONDANSETRON HCL 4 MG/2ML IJ SOLN: 4.0000 mg | Freq: Four times a day (QID) | INTRAMUSCULAR | Status: DC | PRN

## 2019-01-30 MED ORDER — PANTOPRAZOLE SODIUM 40 MG IV SOLR
40.0000 mg | Freq: Once | INTRAVENOUS | Status: AC
  Administered 2019-01-30: 11:00:00 40 mg via INTRAVENOUS
  Filled 2019-01-30: qty 40

## 2019-01-30 MED ORDER — ALBUTEROL SULFATE (2.5 MG/3ML) 0.083% IN NEBU: 2.5000 mg | INHALATION_SOLUTION | RESPIRATORY_TRACT | Status: DC | PRN

## 2019-01-30 MED ORDER — BISACODYL 10 MG RE SUPP: 10.0000 mg | Freq: Every day | RECTAL | Status: DC | PRN

## 2019-01-30 NOTE — H&P (Signed)
History and Physical    [name] WGN:[id number] DOB: 1970/11/22 DOA: 01/30/2019  PCP: Vivien Presto, MD   Patient coming from: Prison   Chief Complaint: Black Stools and Abdominal Pain  HPI: [name] is a 48 y.o. 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  States that he noticed black stools 3 days ago and had associated symptoms with abdominal pain and discomfort along with some nausea and some dizziness but no vomiting.  Patient states that he does get short of breath on exertion but denies any chest pain.  States that he had blood clot over 2 years ago and has had a blood clot in his lungs and has been taking his medication twice a day for it.  Admitted to taking some naproxen for some calf pain twice a day for over almost a month.  Last dose of Eliquis was this morning.  He was brought in for his black stools and epigastric pain and evaluated and found to have a suspected upper GI bleed in the setting of NSAID use along with his anticoagulant.  Gastroenterology was consulted and TR[facility] was called admit this patient and he has been placed on a clear liquid diet.  ED Course: In the ED patient had basic blood work done including a CBC, CMP and was found to be FOBT positive.  He was given IV Protonix 40 mg and bolus of normal saline 1 L.  SARS coronavirus 2 testing was negative  Review of Systems: As per HPI otherwise 10 point review of systems negative.   Past Medical History:  Diagnosis Date  . Deep vein thrombosis (DVT) (HCC)   . Hypertension   . Pulmonary embolism [facility])    Past Surgical History:  Procedure Laterality Date  . MANDIBLE SURGERY  1996   SOCIAL HISTORY  reports that he has never smoked. He has never used smokeless tobacco. He  reports current alcohol use of about 3.0 - 4.0 standard drinks of alcohol per week. He reports that he does not use drugs.  ALLERGIES No Known Allergies  Family History  Problem Relation Age of Onset  . Heart attack Father   - Mom has no Medical issues.   Prior to Admission medications   Medication Sig Start Date End Date Taking? Authorizing Provider  amLODipine (NORVASC) 5 MG tablet Take 5 mg by mouth daily.  11/06/18 11/06/19 Yes [provider]  apixaban (ELIQUIS) 5 MG TABS tablet Take 1 tablet (5 mg total) by mouth 2 (two) times daily. 02/14/17  Yes Virl Axe, MD  hydrochlorothiazide (MICROZIDE) 12.5 MG capsule Take 12.5 mg by mouth daily. 12/02/18  Yes [provider]   Physical Exam: Vitals:   01/30/19 1029 01/30/19 1031  BP: 109/76   Pulse: 79   Resp: 16   Temp: 98.6 F (37 C)   TempSrc: Oral   SpO2: 99%   Weight:  81.6 kg  Height:   (1.727 m)   Constitutional: WN/WD slightly overweight male currently in NAD and appears calm and comfortable Eyes:  Lids and conjunctivae normal, sclerae anicteric  ENMT: External Ears, Nose appear normal. Grossly normal hearing.  Neck: Appears normal, supple, no cervical masses, normal ROM, no appreciable thyromegaly; no JVD Respiratory: Diminished to auscultation bilaterally, no wheezing, rales, rhonchi or crackles. Normal  respiratory effort and patient is not tachypenic. No accessory muscle use.  Cardiovascular: RRR, no murmurs / rubs / gallops. S1 and S2 auscultated. No extremity edema.  Abdomen: Soft, Tender to palpate, non-distended. No masses palpated. No appreciable hepatosplenomegaly. Bowel sounds positive x4.  GU: Deferred. Musculoskeletal: No clubbing / cyanosis of digits/nails. No joint deformity upper and lower extremities.  Skin: No rashes, lesions, ulcers. No induration; Warm and dry. Has multiple skin tattoos  Neurologic: CN 2-12 grossly intact with no focal deficits. Romberg sign and cerebellar  reflexes not assessed.  Psychiatric: Normal judgment and insight. Alert and oriented x 3. Normal mood and appropriate affect.   Labs on Admission: I have personally reviewed following labs and imaging studies  CBC: Recent Labs  Lab 01/30/19 1120  WBC 4.0  NEUTROABS 2.5  HGB 11.1*  HCT 34.7*  MCV 84.0  PLT 199   Basic Metabolic Panel: Recent Labs  Lab 01/30/19 1120  NA 140  K 3.9  CL 105  CO2 26  GLUCOSE 102*  BUN 28*  CREATININE 0.91  CALCIUM 9.0   GFR: Estimated Creatinine Clearance: 95 mL/min (by C-G formula based on SCr of 0.91 mg/dL). Liver Function Tests: Recent Labs  Lab 01/30/19 1120  AST 53*  ALT 28  ALKPHOS 62  BILITOT 0.4  PROT 6.9  ALBUMIN 4.1   No results for input(s): LIPASE, AMYLASE in the last 168 hours. No results for input(s): AMMONIA in the last 168 hours. Coagulation Profile: No results for input(s): INR, PROTIME in the last 168 hours. Cardiac Enzymes: No results for input(s): CKTOTAL, CKMB, CKMBINDEX, TROPONINI in the last 168 hours. BNP (last 3 results) No results for input(s): PROBNP in the last 8760 hours. HbA1C: No results for input(s): HGBA1C in the last 72 hours. CBG: No results for input(s): GLUCAP in the last 168 hours. Lipid Profile: No results for input(s): CHOL, HDL, LDLCALC, TRIG, CHOLHDL, LDLDIRECT in the last 72 hours. Thyroid Function Tests: No results for input(s): TSH, T4TOTAL, FREET4, T3FREE, THYROIDAB in the last 72 hours. Anemia Panel: No results for input(s): VITAMINB12, FOLATE, FERRITIN, TIBC, IRON, RETICCTPCT in the last 72 hours. Urine analysis:    Component Value Date/Time   APPEARANCEUR Turbid (A) 01/17/2017 1145   GLUCOSEU Negative 01/17/2017 1145   BILIRUBINUR Negative 01/17/2017 1145   PROTEINUR 1+ (A) 01/17/2017 1145   NITRITE Negative 01/17/2017 1145   LEUKOCYTESUR Negative 01/17/2017 1145   Sepsis Labs: !!!!!!!!!!!!!!!!!!!!!!!!!!!!!!!!!!!!!!!!!!!! @LABRCNTIP (procalcitonin:4,lacticidven:4) )  Recent Results (from the past 240 hour(s))  SARS Coronavirus 2 (CEPHEID - Performed in [facility] hospital lab), Hosp Order     Status: None   Collection Time: 01/30/19 11:21 AM  Result Value Ref Range Status   SARS Coronavirus 2 NEGATIVE NEGATIVE Final    Comment: (NOTE) If result is NEGATIVE SARS-CoV-2 target nucleic acids are NOT DETECTED. The SARS-CoV-2 RNA is generally detectable in upper and lower  respiratory specimens during the acute phase of infection. The lowest  concentration of SARS-CoV-2 viral copies this assay can detect is 250  copies / mL. A negative result does not preclude SARS-CoV-2 infection  and should not be used as the sole basis for treatment or other  patient management decisions.  A negative result may occur with  improper specimen collection / handling, submission of specimen other  than nasopharyngeal swab, presence of viral mutation(s) within the  areas targeted by this assay, and inadequate number of viral copies  (<250 copies / mL). A negative result must be combined with clinical  observations, patient history, and epidemiological information. If result is POSITIVE SARS-CoV-2 target nucleic acids are DETECTED. The SARS-CoV-2 RNA is generally detectable in upper and lower  respiratory specimens dur ing the acute phase of infection.  Positive  results are indicative of active infection with SARS-CoV-2.  Clinical  correlation with patient history and other diagnostic information is  necessary to determine patient infection status.  Positive results do  not rule out bacterial infection or co-infection with other viruses. If result is PRESUMPTIVE POSTIVE SARS-CoV-2 nucleic acids MAY BE PRESENT.   A presumptive positive result was obtained on the submitted specimen  and confirmed on repeat testing.  While 2019 novel coronavirus  (SARS-CoV-2) nucleic acids may be present in the submitted sample  additional confirmatory testing may be necessary for  epidemiological  and / or clinical management purposes  to differentiate between  SARS-CoV-2 and other Sarbecovirus currently known to infect humans.  If clinically indicated additional testing with an alternate test  methodology [id number]) is advised. The SARS-CoV-2 RNA is generally  detectable in upper and lower respiratory sp ecimens during the acute  phase of infection. The expected result is Negative. Fact Sheet for Patients:  BoilerBrush.com.cy Fact Sheet for Healthcare Providers: https://pope.com/ This test is not yet approved or cleared by the [place] FDA and has been authorized for detection and/or diagnosis of SARS-CoV-2 by FDA under an Emergency Use Authorization (EUA).  This EUA will remain in effect (meaning this test can be used) for the duration of the COVID-19 declaration under Section 564(b)(1) of the Act, 21 U.S.C. section 360bbb-3(b)(1), unless the authorization is terminated or revoked sooner. Performed at [facility], [address] [place], [place] [address]     Radiological Exams on Admission: No results found.  EKG: No EKG done on admission but will order one now.   Assessment/Plan Active Problems:   GI bleed  Acute GI bleeding with melanotic stools suspect an upper GI bleed in the setting of anticoagulation with Eliquis long with concomitant NSAID use Normocytic Anemia -Admit as an inpatient to telemetry and hold antihypertensives along with anticoagulation with Eliquis -Avoid NSAIDs; Patient has been taking naproxen twice a day for the last month -GI consulted and will place on a clear liquid diet -Protonix drip  -2 large-bore IVs and will cycle H&H's every 6 -Received a bolus of normal saline in the ED due to his orthostasis  -Check anemia panel in a.m. -Likely upper GI bleed in nature given his melena and elevated BUN suspect it is an ulceration -Continue to Monitor and Repeat CBC in AM   Hypertension but currently having softer BP -BP was 109/76 this Admission and was a little orthostatic in the ED  -Currently holding amlodipine 5 mg p.o. daily along with hydrochlorothiazide 12.5 mg p.o. twice daily  Hyperglycemia -Check hemoglobin A1c in the a.m. -Patient's blood glucose on admission was 102 -Continue monitor blood sugars carefully and if consistently elevated will need to place on sensitive and alongside scale insulin  History of DVT and PEs -Currently holding anticoagulation as above and will need to resume Eliquis when safe to do so per GI recommendations  Elevated AST -Likely reactive -Continue monitor and trend -If consistently elevated or not improving will obtain a right upper quadrant ultrasound along with a acute hepatitis panel -Repeat CMP in a.m.  DVT prophylaxis: SCDs Code Status: FULL CODE Family Communication: No Disposition Plan: Remain Inpatient for EGD Consults called: Gastroenterology Admission status: Inpatient Telemetry  Severity of Illness: The appropriate  patient status for this patient is INPATIENT. Inpatient status is judged to be reasonable and necessary in order to provide the required intensity of service to ensure the patient's safety. The patient's presenting symptoms, physical exam findings, and initial radiographic and laboratory data in the context of their chronic comorbidities is felt to place them at high risk for further clinical deterioration. Furthermore, it is not anticipated that the patient will be medically stable for discharge from the hospital within 2 midnights of admission. The following factors support the patient status of inpatient.   " The patient's presenting symptoms include Epigastric Pain and Dark Stools. " The worrisome physical exam findings include Anemia and FOBT Positive . " The initial radiographic and laboratory data are worrisome because of Elevated BUN and Drop in Hb/Hct. " The chronic co-morbidities are  as above.  * I certify that at the point of admission it is my clinical judgment that the patient will require inpatient hospital care spanning beyond 2 midnights from the point of admission due to high intensity of service, high risk for further deterioration and high frequency of surveillance required.Merlene Laughter, D.O. [facility] is on AMION  If 7PM-7AM, please contact night-coverage [email or web address] Password [email or web address]  01/30/2019, 2:57 PM

## 2019-01-30 NOTE — ED Triage Notes (Signed)
Patient brought in from [facility] due to abdominal pain with black stools x 3 days per [facility]. Patient is on eloquis due to previous diagnosis of PE in 2010.

## 2019-01-30 NOTE — Consult Note (Addendum)
Referring Provider: No ref. provider found Primary Care Physician:  Curly Rim, MD Primary Gastroenterologist:  Dr. Gala Romney (previously unassigned)  Date of Admission: 01/30/2019 Date of Consultation: 01/30/2019  Reason for Consultation:  Anemia, dark stools, on anticoagulation  HPI:  [name] is a 48 y.o. 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 He was brought in from the [facility] jail complaining of abdominal pain and dark stools.  Labs in the emergency department found normal white blood cell count, hemoglobin at 11.1 which was previously normal at 15.21-year ago.  Normocytic, normochromic.  Platelet count normal at 199.  CMP found elevated BUN at 28, normal creatinine at 0.91.  AST mildly elevated at 53 other LFTs and electrolytes normal.  Occult blood card in the emergency department was positive for blood.  Today he states he had a blood clot in his leg in his lungs in 2010.  He was initially on Coumadin for a number years but has been switched to Eliquis.  He began taking naproxen about 3 weeks ago for leg pain and has been taking it twice a day.  3 days ago he started having dark stools and has had a total of 3-4 black bowel movements.  Associated weakness, fatigue, mild shortness of breath.  Denies chest pain.  Is also had some looser stools along with his melena.  Also complains of epigastric abdominal pain that started about the time his dark stool started.  Mild nausea but no vomiting.  No other GI complaints.  Last dose of Eliquis this morning (01/30/2019 at 7 am)  Last oral intake 01/30/2019 (6:30 am).  Past Medical History:  Diagnosis Date  . Deep vein thrombosis (DVT) ([place])   . Hypertension   . Pulmonary embolism [facility])     Past Surgical History:  Procedure Laterality Date  . [place]    Prior to Admission medications   Medication Sig Start Date End Date Taking? Authorizing Provider  amLODipine  (NORVASC) 5 MG tablet Take 5 mg by mouth daily.  11/06/18 11/06/19 Yes [provider]  apixaban (ELIQUIS) 5 MG TABS tablet Take 1 tablet (5 mg total) by mouth 2 (two) times daily. 02/14/17  Yes Tawni Millers, MD  hydrochlorothiazide (MICROZIDE) 12.5 MG capsule Take 12.5 mg by mouth daily. 12/02/18  Yes [provider]    No current facility-administered medications for this encounter.    Current Outpatient Medications  Medication Sig Dispense Refill  . amLODipine (NORVASC) 5 MG tablet Take 5 mg by mouth daily.     Marland Kitchen apixaban (ELIQUIS) 5 MG TABS tablet Take 1 tablet (5 mg total) by mouth 2 (two) times daily. 90 tablet 7  . hydrochlorothiazide (MICROZIDE) 12.5 MG capsule Take 12.5 mg by mouth daily.      Allergies as of 01/30/2019  . (No Known Allergies)    Family History  Problem Relation Age of Onset  . Heart attack Father     Social History   Socioeconomic History  . Marital status: Single    Spouse name: Not on file  . Number of children: Not on file  . Years of education: Not on file  . Highest education level: Not on file  Occupational History  . Not on file  Social Needs  . Financial resource strain: Not on file  . Food insecurity:    Worry: Not on file    Inability: Not on file  . Transportation needs:  Medical: Not on file    Non-medical: Not on file  Tobacco Use  . Smoking status: Never Smoker  . Smokeless tobacco: Never Used  Substance and Sexual Activity  . Alcohol use: Yes    Alcohol/week: 3.0 - 4.0 standard drinks    Types: 3 - 4 Cans of beer per week    Comment: per day   . Drug use: No  . Sexual activity: Yes    Partners: Female    Birth control/protection: None  Lifestyle  . Physical activity:    Days per week: Not on file    Minutes per session: Not on file  . Stress: Not on file  Relationships  . Social connections:    Talks on phone: Not on file    Gets together: Not on file    Attends religious service: Not on file     Active member of club or organization: Not on file    Attends meetings of clubs or organizations: Not on file    Relationship status: Not on file  . Intimate partner violence:    Fear of current or ex partner: Not on file    Emotionally abused: Not on file    Physically abused: Not on file    Forced sexual activity: Not on file  Other Topics Concern  . Not on file  Social History Narrative  . Not on file    Review of Systems: General: Negative for anorexia, weight loss, fever, chills ENT: Negative for hoarseness, difficulty swallowing. CV: Negative for chest pain, angina, palpitations, peripheral edema.  Respiratory: Negative for dyspnea at rest, cough, sputum, wheezing.  GI: See history of present illness. MS: Admits recent leg pain.  Derm: Negative for rash or itching.  Endo: Negative for unusual weight change.  Heme: Negative for bruising or bleeding. Allergy: Negative for rash or hives.  Physical Exam: Vital signs in last 24 hours: Temp:  [98.6 F (37 C)] 98.6 F (37 C) (06/04 1029) Pulse Rate:  [79] 79 (06/04 1029) Resp:  [16] 16 (06/04 1029) BP: (109)/(76) 109/76 (06/04 1029) SpO2:  [99 %] 99 % (06/04 1029) Weight:  [81.6 kg] 81.6 kg (06/04 1031)   General:   Alert,  Well-developed, well-nourished, pleasant and cooperative in NAD. Handcuffs in place right [place] and bilateral LE. Head:  Normocephalic and atraumatic. Eyes:  Sclera clear, no icterus. Conjunctiva pink. Ears:  Normal auditory acuity. Neck:  Supple; no masses or thyromegaly. Lungs:  Clear throughout to auscultation. No wheezes, crackles, or rhonchi. No acute distress. Heart:  Regular rate and rhythm; no murmurs, clicks, rubs,  or gallops. Abdomen:  Soft, and nondistended. Mild to moderate epigastric TTP. No masses, hepatosplenomegaly or hernias noted. Normal bowel sounds, without guarding, and without rebound.   Rectal:  Deferred until time of colonoscopy.   Msk:  Symmetrical without gross  deformities. Pulses:  Normal pulses noted. Extremities:  Without clubbing or edema. Neurologic:  Alert and  oriented x4;  grossly normal neurologically. Psych:  Alert and cooperative. Normal mood and affect.  Intake/Output from previous day: No intake/output data recorded. Intake/Output this shift: No intake/output data recorded.  Lab Results: Recent Labs    01/30/19 1120  WBC 4.0  HGB 11.1*  HCT 34.7*  PLT 199   BMET Recent Labs    01/30/19 1120  NA 140  K 3.9  CL 105  CO2 26  GLUCOSE 102*  BUN 28*  CREATININE 0.91  CALCIUM 9.0   LFT Recent Labs  01/30/19 1120  PROT 6.9  ALBUMIN 4.1  AST 53*  ALT 28  ALKPHOS 62  BILITOT 0.4   PT/INR No results for input(s): LABPROT, INR in the last 72 hours. Hepatitis Panel No results for input(s): HEPBSAG, HCVAB, HEPAIGM, HEPBIGM in the last 72 hours. C-Diff No results for input(s): CDIFFTOX in the last 72 hours.  Studies/Results: No results found.  Impression: Very pleasant 48 year old male who presents for weakness, fatigue, abdominal pain, black stools over the past 3 days.  He has been taking naproxen for the past 3 weeks, approximately twice a day.  He is also on Eliquis anticoagulation for history of DVT and PE in 2010.  He has had a total of 3-4 dark stools.  He also has epigastric pain and associated dyspepsia type symptoms.  His last dose of Eliquis was this morning.  Anemia confirmed with a hemoglobin in the 11 range and baseline at 15.  Heme positive in the ED.  Likely upper GI bleed in the setting of anticoagulation on NSAIDs.  Differentials include esophagitis, gastritis, duodenitis, gastric ulcer, duodenal ulcer.  He will need endoscopic evaluation for further management.   Plan: 1. Hold anticoagulation for now 2. PPI drip 3. Clear liquid diet for now 4. NPO after midnight night before EGD 5. We will need to hold Eliquis for 48 hours prior to EGD.  Earliest possible day for EGD is Saturday, possibly  as late as Monday 6. Would benefit from propofol/MAC due to 3-4 drinks a day 7. Monitor for recurrent GI bleed 8. Monitor hemoglobin and transfuse as necessary 9. Supportive measures   Thank you for allowing [place] to participate in the care of [place], DNP, AGNP-C Adult & Gerontological Nurse Practitioner [facility]    LOS: 0 days     01/30/2019, 2:11 PM  Attending note: Agree with above assessment and recommendations.  Discussed EGD with patient.  We will plan 6/ 6 To allow Eliquis washout.  The risks, benefits, limitations, alternatives and imponderables have been reviewed with the patient. Potential for esophageal dilation, biopsy, etc. have also been reviewed.  Questions have been answered. All parties agreeable.  Further recommendations to follow.

## 2019-01-30 NOTE — ED Provider Notes (Signed)
[facility] EMERGENCY DEPARTMENT Provider Note   CSN: [id number] Arrival date & time: 01/30/19  [id number]    History   Chief Complaint Chief Complaint  Patient presents with  . Abdominal Pain  . Melena    HPI [name] is a 48 y.o. male.     Patient complains of abdominal pain.  He takes Eliquis for previous PE and has been having black stools for 3 days with abdominal pain  The history is provided by the patient. No language interpreter was used.  Abdominal Pain  Pain location:  Epigastric Pain quality: aching   Pain radiates to:  Does not radiate Pain severity:  Moderate Onset quality:  Sudden Timing:  Constant Progression:  Worsening Chronicity:  New Relieved by:  Nothing Worsened by:  Nothing Ineffective treatments:  None tried Associated symptoms: anorexia   Associated symptoms: no chest pain, no cough, no diarrhea, no fatigue and no hematuria     Past Medical History:  Diagnosis Date  . Deep vein thrombosis (DVT) ([facility])   . Hypertension   . Pulmonary embolism [facility])     Patient Active Problem List   Diagnosis Date Noted  . 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 MANDIBLE SURGERY  1996        Home Medications    Prior to Admission medications   Medication Sig Start Date End Date Taking? Authorizing Provider  amLODipine (NORVASC) 5 MG tablet Take 5 mg by mouth daily.  11/06/18 11/06/19 Yes [provider]  apixaban (ELIQUIS) 5 MG TABS tablet Take 1 tablet (5 mg total) by mouth 2 (two) times daily. 02/14/17  Yes Virl Axehaplin, Don C, MD  hydrochlorothiazide (MICROZIDE) 12.5 MG capsule Take 12.5 mg by mouth daily. 12/02/18  Yes [provider]    Family History Family History  Problem Relation Age of Onset  . Heart attack Father     Social History Social History   Tobacco Use  . Smoking status: Never Smoker  . Smokeless tobacco:  Never Used  Substance Use Topics  . Alcohol use: Yes    Alcohol/week: 3.0 - 4.0 standard drinks    Types: 3 - 4 Cans of beer per week    Comment: per day   . Drug use: No     Allergies   Patient has no known allergies.   Review of Systems Review of Systems  Constitutional: Negative for appetite change and fatigue.  HENT: Negative for congestion, ear discharge and sinus pressure.   Eyes: Negative for discharge.  Respiratory: Negative for cough.   Cardiovascular: Negative for chest pain.  Gastrointestinal: Positive for abdominal pain, anorexia and blood in stool. Negative for diarrhea.  Genitourinary: Negative for frequency and hematuria.  Musculoskeletal: Negative for back pain.  Skin: Negative for rash.  Neurological: Negative for seizures and headaches.  Psychiatric/Behavioral: Negative for hallucinations.     Physical Exam Updated Vital Signs BP 109/76 (BP Location: Right Arm)   Pulse 79   Temp 98.6 F (37 C) (Oral)   Resp 16   Ht 5\' 8"  (1.727 m)   Wt 81.6 kg   SpO2 99%   BMI 27.37 kg/m   Physical Exam Vitals signs and nursing note reviewed.  Constitutional:      Appearance: He is well-developed.  HENT:     Head: Normocephalic.     Nose: Nose normal.  Eyes:     General: No  scleral icterus.    Conjunctiva/sclera: Conjunctivae normal.  Neck:     Musculoskeletal: Neck supple.     Thyroid: No thyromegaly.  Cardiovascular:     Rate and Rhythm: Normal rate and regular rhythm.     Heart sounds: No murmur. No friction rub. No gallop.   Pulmonary:     Breath sounds: No stridor. No wheezing or rales.  Chest:     Chest wall: No tenderness.  Abdominal:     General: There is no distension.     Tenderness: There is no abdominal tenderness. There is no rebound.  Genitourinary:    Comments: Hem pos black stool Musculoskeletal: Normal range of motion.  Lymphadenopathy:     Cervical: No cervical adenopathy.  Skin:    Findings: No erythema or rash.   Neurological:     Mental Status: He is oriented to person, place, and time.     Motor: No abnormal muscle tone.     Coordination: Coordination normal.  Psychiatric:        Behavior: Behavior normal.      ED Treatments / Results  Labs (all labs ordered are listed, but only abnormal results are displayed) Labs Reviewed  CBC WITH DIFFERENTIAL/PLATELET - Abnormal; Notable for the following components:      Result Value   RBC 4.13 (*)    Hemoglobin 11.1 (*)    HCT 34.7 (*)    All other components within normal limits  COMPREHENSIVE METABOLIC PANEL - Abnormal; Notable for the following components:   Glucose, Bld 102 (*)    BUN 28 (*)    AST 53 (*)    All other components within normal limits  POC OCCULT BLOOD, ED - Abnormal; Notable for the following components:   Fecal Occult Bld POSITIVE (*)    All other components within normal limits  SARS CORONAVIRUS 2 (HOSPITAL ORDER, PERFORMED IN [facility])  TYPE AND SCREEN    EKG None  Radiology No results found.  Procedures Procedures (including critical care time)  Medications Ordered in ED Medications  pantoprazole (PROTONIX) injection 40 mg (40 mg Intravenous Given 01/30/19 1113)  sodium chloride 0.9 % bolus 1,000 mL (0 mLs Intravenous Stopped 01/30/19 1249)     Initial Impression / Assessment and Plan / ED Course  I have reviewed the triage vital signs and the nursing notes.  Pertinent labs & imaging results that were available during my care of the patient were reviewed by me and considered in my medical decision making (see chart for details).        CRITICAL CARE Performed by: Bethann Berkshire Total critical care time: 40 minutes Critical care time was exclusive of separately billable procedures and treating other patients. Critical care was necessary to treat or prevent imminent or life-threatening deterioration. Critical care was time spent personally by me on the following activities: development of  treatment plan with patient and/or surrogate as well as nursing, discussions with consultants, evaluation of patient's response to treatment, examination of patient, obtaining history from patient or surrogate, ordering and performing treatments and interventions, ordering and review of laboratory studies, ordering and review of radiographic studies, pulse oximetry and re-evaluation of patient's condition.  Patient with upper GI bleed and on Eliquis.  He will be admitted to medicine with GI consult Final Clinical Impressions(s) / ED Diagnoses   Final diagnoses:  Pain of upper abdomen    ED Discharge Orders    None       Bethann Berkshire, MD  01/30/19 1425  

## 2019-01-31 LAB — CBC WITH DIFFERENTIAL/PLATELET
Abs Immature Granulocytes: 0.01 10*3/uL (ref 0.00–0.07)
Basophils Absolute: 0 10*3/uL (ref 0.0–0.1)
Basophils Relative: 1 %
Eosinophils Absolute: 0.2 10*3/uL (ref 0.0–0.5)
Eosinophils Relative: 4 %
HCT: 31.8 % — ABNORMAL LOW (ref 39.0–52.0)
Hemoglobin: 10.2 g/dL — ABNORMAL LOW (ref 13.0–17.0)
Immature Granulocytes: 0 %
Lymphocytes Relative: 38 %
Lymphs Abs: 1.7 10*3/uL (ref 0.7–4.0)
MCH: 26.9 pg (ref 26.0–34.0)
MCHC: 32.1 g/dL (ref 30.0–36.0)
MCV: 83.9 fL (ref 80.0–100.0)
Monocytes Absolute: 0.5 10*3/uL (ref 0.1–1.0)
Monocytes Relative: 11 %
Neutro Abs: 2 10*3/uL (ref 1.7–7.7)
Neutrophils Relative %: 46 %
Platelets: 180 10*3/uL (ref 150–400)
RBC: 3.79 MIL/uL — ABNORMAL LOW (ref 4.22–5.81)
RDW: 13 % (ref 11.5–15.5)
WBC: 4.4 10*3/uL (ref 4.0–10.5)
nRBC: 0 % (ref 0.0–0.2)

## 2019-01-31 LAB — COMPREHENSIVE METABOLIC PANEL
ALT: 25 U/L (ref 0–44)
AST: 47 U/L — ABNORMAL HIGH (ref 15–41)
Albumin: 3.7 g/dL (ref 3.5–5.0)
Alkaline Phosphatase: 57 U/L (ref 38–126)
Anion gap: 8 (ref 5–15)
BUN: 13 mg/dL (ref 6–20)
CO2: 24 mmol/L (ref 22–32)
Calcium: 8.7 mg/dL — ABNORMAL LOW (ref 8.9–10.3)
Chloride: 109 mmol/L (ref 98–111)
Creatinine, Ser: 0.88 mg/dL (ref 0.61–1.24)
GFR calc Af Amer: >60 mL/min (ref >60)
GFR calc non Af Amer: >60 mL/min (ref >60)
Glucose, Bld: 103 mg/dL — ABNORMAL HIGH (ref 70–99)
Potassium: 4.3 mmol/L (ref 3.5–5.1)
Sodium: 141 mmol/L (ref 135–145)
Total Bilirubin: 0.7 mg/dL (ref 0.3–1.2)
Total Protein: 6.4 g/dL — ABNORMAL LOW (ref 6.5–8.1)

## 2019-01-31 LAB — HEMOGLOBIN AND HEMATOCRIT, BLOOD
HCT: 33.4 % — ABNORMAL LOW (ref 39.0–52.0)
HCT: 32.4 % — ABNORMAL LOW (ref 39.0–52.0)
HCT: 32.3 % — ABNORMAL LOW (ref 39.0–52.0)
Hemoglobin: 10.4 g/dL — ABNORMAL LOW (ref 13.0–17.0)
Hemoglobin: 10.2 g/dL — ABNORMAL LOW (ref 13.0–17.0)
Hemoglobin: 10.3 g/dL — ABNORMAL LOW (ref 13.0–17.0)

## 2019-01-31 LAB — RETICULOCYTES
Immature Retic Fract: 14.2 % (ref 2.3–15.9)
RBC.: 3.93 MIL/uL — ABNORMAL LOW (ref 4.22–5.81)
Retic Count, Absolute: 88 10*3/uL (ref 19.0–186.0)
Retic Ct Pct: 2.2 % (ref 0.4–3.1)

## 2019-01-31 LAB — IRON AND TIBC
Iron: 91 ug/dL (ref 45–182)
Saturation Ratios: 31 % (ref 17.9–39.5)
TIBC: 297 ug/dL (ref 250–450)
UIBC: 206 ug/dL

## 2019-01-31 LAB — FERRITIN: Ferritin: 81 ng/mL (ref 24–336)

## 2019-01-31 LAB — FOLATE: Folate: 9.2 ng/mL (ref >5.9)

## 2019-01-31 LAB — PHOSPHORUS: Phosphorus: 2.7 mg/dL (ref 2.5–4.6)

## 2019-01-31 LAB — GLUCOSE, CAPILLARY: Glucose-Capillary: 78 mg/dL (ref 70–99)

## 2019-01-31 LAB — VITAMIN B12: Vitamin B-12: 267 pg/mL (ref 180–914)

## 2019-01-31 LAB — MAGNESIUM: Magnesium: 2 mg/dL (ref 1.7–2.4)

## 2019-01-31 MED ORDER — PANTOPRAZOLE SODIUM 40 MG IV SOLR
INTRAVENOUS | Status: AC
  Filled 2019-01-31: qty 80

## 2019-01-31 NOTE — Progress Notes (Addendum)
Subjective: Abdominal pain about the same. No other abdominal pain. Tolerating clears so far. No further BM since admission but feels he may need to have one soon. Denies N/V. No other GI complaints.  Objective: Vital signs in last 24 hours: Temp:  [98 F (36.7 C)-98.6 F (37 C)] 98 F (36.7 C) (06/05 0539) Pulse Rate:  [62-79] 65 (06/05 0539) Resp:  [16-20] 17 (06/05 0539) BP: (106-119)/(69-80) 106/70 (06/05 0539) SpO2:  [99 %-100 %] 99 % (06/05 0539) Weight:  [79.5 kg-81.6 kg] 80.1 kg (06/05 0500) Last BM Date: 01/30/19 General:   Alert and oriented, pleasant Head:  Normocephalic and atraumatic. Eyes:  No icterus, sclera clear. Conjuctiva pink.  Heart:  S1, S2 present, no murmurs noted.  Lungs: Clear to auscultation bilaterally, without wheezing, rales, or rhonchi.  Abdomen:  Bowel sounds present, soft, non-tender, non-distended. No HSM or hernias noted. No rebound or guarding. No masses appreciated  Msk:  Symmetrical without gross deformities. Extremities:  Without clubbing or edema. Neurologic:  Alert and  oriented x4 Skin:  Warm and dry, intact without significant lesions.  Psych:  Alert and cooperative. Normal mood and affect.  Intake/Output from previous day: 06/04 0701 - 06/05 0700 In: 1823.2 [P.O.:600; I.V.:1223.2] Out: 2400 [Urine:2400] Intake/Output this shift: No intake/output data recorded.  Lab Results: Recent Labs    01/30/19 1120 01/30/19 1515 01/30/19 2108 01/31/19 0300  WBC 4.0  --   --  4.4  HGB 11.1* 10.4* 10.4* 10.2*  HCT 34.7* 32.5* 31.8* 31.8*  PLT 199  --   --  180   BMET Recent Labs    01/30/19 1120  NA 140  K 3.9  CL 105  CO2 26  GLUCOSE 102*  BUN 28*  CREATININE 0.91  CALCIUM 9.0   LFT Recent Labs    01/30/19 1120  PROT 6.9  ALBUMIN 4.1  AST 53*  ALT 28  ALKPHOS 62  BILITOT 0.4   PT/INR No results for input(s): LABPROT, INR in the last 72 hours. Hepatitis Panel No results for input(s): HEPBSAG, HCVAB, HEPAIGM,  HEPBIGM in the last 72 hours.   Studies/Results: No results found.  Assessment: Very pleasant 48 year old male who presents for weakness, fatigue, abdominal pain, black stools over the past 3 days.  He has been taking naproxen for the past 3 weeks, approximately twice a day.  He is also on Eliquis anticoagulation for history of DVT and PE in 2010.  He has had a total of 3-4 dark stools.  He also has epigastric pain and associated dyspepsia type symptoms.  His last dose of Eliquis was the morning of 01/30/2019.  Anemia confirmed with a hemoglobin in the 11 range and baseline at 15.  Heme positive in the ED.  Likely upper GI bleed in the setting of anticoagulation on NSAIDs.  Differentials include esophagitis, gastritis, duodenitis, gastric ulcer, duodenal ulcer.  He will need endoscopic evaluation for further management.  Labs this morning show drifting hgb from 11.1 yesterday to 10.4 last night and 10.2 today. Stable today with some continued epigastric pain. On PPI gtt. Planned EGD tomorrow.  Patient has had persistent AST elevation, incarcerated which confers HCV risk.  Proceed with EGD on propofol/MAC with Dr. Gala Romney in near future: the risks, benefits, and alternatives have been discussed with the patient in detail. The patient states understanding and desires to proceed.  Due to history of daily ETOH use will plan for propofol/MAC.  Plan: 1. EGD on propofol/MAC tomorrow morning (planned 10:00 am)  2. Monitor hgb 3. Transfuse as necessary 4. Monitor for recurrent GI bleed 5. Continue PPI gtt 72 hours pending EGD results 6. Check acute hepatitis panel for transaminitis 7. Supportive measures   Thank you for allowing [place] to participate in the care of [place], DNP, AGNP-C Adult & Gerontological Nurse Practitioner [facility]     LOS: 1 day    01/31/2019, 8:31 AM

## 2019-01-31 NOTE — Progress Notes (Signed)
PROGRESS NOTE    Steve Krichbaum  OIZ:[id number] DOB: 1970-12-29 DOA: 01/30/2019 PCP: Vivien Presto, MD   Brief Narrative:  HPI: [name] is a 48 y.o. 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  States that he noticed black stools 3 days ago and had associated symptoms with abdominal pain and discomfort along with some nausea and some dizziness but no vomiting.  Patient states that he does get short of breath on exertion but denies any chest pain.  States that he had blood clot over 2 years ago and has had a blood clot in his lungs and has been taking his medication twice a day for it.  Admitted to taking some naproxen for some calf pain twice a day for over almost a month.  Last dose of Eliquis was this morning.  He was brought in for his black stools and epigastric pain and evaluated and found to have a suspected upper GI bleed in the setting of NSAID use along with his anticoagulant.  Gastroenterology was consulted and [facility] was called admit this patient and he has been placed on a clear liquid diet.  ED Course: In the ED patient had basic blood work done including a CBC, CMP and was found to be FOBT positive.  He was given IV Protonix 40 mg and bolus of normal saline 1 L.  SARS coronavirus 2 testing was negative  **Interim History Patient will be continued on PPI with plan EGD tomorrow.  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)   GI bleed   Chronic anticoagulation   NSAID long-term use   History of pulmonary embolus (PE)   History of DVT in adulthood   Dyspepsia   Epigastric pain   Normocytic anemia   Essential hypertension   Hyperglycemia   Elevated AST (SGOT)  Acute  GI bleeding with melanotic stools suspect an upper GI bleed in the setting of anticoagulation with Eliquis long with concomitant NSAID use Normocytic Anemia -Admit as an inpatient to telemetry and hold antihypertensives along with anticoagulation with Eliquis -Avoid NSAIDs; Patient has been taking naproxen twice a day for the last month -GI consulted and will place on a clear liquid diet -C/w Protonix drip  -2 large-bore IVs and will cycle H&H's every 6 -Hb/Hct went from 11.1/34.7 -> 10.4/32.5 -> 10.4/31.8 -> 10.2/31.8 -> 10.4/33.4 -Received a bolus of normal saline in the ED due to his orthostasis  -Checked Anemia Panel and showed iron level of 91, U IBC of 206, TIBC 297, saturation ratio of 31%, ferritin of 81, folate level of 9.2, and vitamin B12 level of 267 -Likely upper GI bleed in nature given his melena and elevated BUN suspect it is an ulceration -Last dose of Eliquis was the morning of 01/30/2019 and EGD is likely going to be planned for tomorrow -Patient has not had a bowel movement yet this morning but still complaining of some epigastric pain and described it as 5 out of 10 in severity. -Continue to Monitor and Repeat CBC in AM  Hypertension but currently having softer BP -BP was 109/76 this Admission and was a little orthostatic in the ED  -Currently holding amlodipine 5 mg p.o. daily along with hydrochlorothiazide 12.5 mg p.o. twice  daily -BP now is 106/70  Hyperglycemia -Check hemoglobin A1c and this is still pending  -Patient's blood glucose on admission was 102; Blood Glucose this AM on CMP was 103  -CBG this AM was 78 -Continue monitor blood sugars carefully and if consistently elevated will need to place on sensitive and alongside scale insulin  History of DVT and PEs -Currently holding anticoagulation as above and will need to resume Eliquis when safe to do so per GI recommendations  Elevated AST -Likely reactive; AST was 53 on Admission and is now 47 -Continue  monitor and trend -If consistently elevated or not improving will obtain a right upper quadrant ultrasound along with a acute hepatitis panel -Repeat CMP in a.m.  DVT prophylaxis: SCDs Code Status: FULL CODE Family Communication: None Disposition Plan: Remain Inpatient for continued workup and treatment and plan for EGD in AM   Consultants:   Gastroenterology    Procedures:  EGD being planned for 02/01/2019   Antimicrobials:  Anti-infectives (From admission, onward)   None     Subjective: Seen and examined at bedside was still having some crampy midepigastric abdominal pain 5 out of 10 in severity.  Denies any nausea and has not had a bowel movement yet.  No lightheadedness or dizziness.  States he did not sleep last night because of people Coming in and out of the room.  No other concerns or complaints at this time.  Objective: Vitals:   01/30/19 1755 01/30/19 2127 01/31/19 0500 01/31/19 0539  BP: 109/73 119/69  106/70  Pulse: 62 69  65  Resp:  17  17  Temp:  98 F (36.7 C)  98 F (36.7 C)  TempSrc:  Oral  Oral  SpO2: 99% 99%  99%  Weight:   80.1 kg   Height:        Intake/Output Summary (Last 24 hours) at 01/31/2019 0752 Last data filed at 01/31/2019 0524 Gross per 24 hour  Intake 1823.23 ml  Output 2400 ml  Net -576.77 ml   Filed Weights   01/30/19 1031 01/30/19 1602 01/31/19 0500  Weight: 81.6 kg 79.5 kg 80.1 kg   Examination: Physical Exam:  Constitutional: WN/WD Male in NAD and appears calm but slightly uncomfortable  Eyes: Lids and conjunctivae normal, sclerae anicteric  ENMT: External Ears, Nose appear normal. Grossly normal hearing. Mucous membranes are moist.  Neck: Appears normal, supple, no cervical masses, normal ROM, no appreciable thyromegaly; no JVD Respiratory: Diminished to auscultation bilaterally, no wheezing, rales, rhonchi or crackles. Normal respiratory effort and patient is not tachypenic. No accessory muscle use.  Cardiovascular: RRR, no  murmurs / rubs / gallops. S1 and S2 auscultated. .  Abdomen: Soft, mildly-tender, slightly distended. No masses palpated. No appreciable hepatosplenomegaly. Bowel sounds positive x4.  GU: Deferred. Musculoskeletal: No clubbing / cyanosis of digits/nails. No joint deformity upper and lower extremities. Skin: No rashes, lesions, ulcers on a limited skin evaluation. No induration; Warm and dry. Has scattered tattoos on body  Neurologic: CN 2-12 grossly intact with no focal deficits. Romberg sign cerebellar reflexes not assessed.  Psychiatric: Normal judgment and insight. Alert and oriented x 3. Normal mood and appropriate affect.   Data Reviewed: I have personally reviewed following labs and imaging studies  CBC: Recent Labs  Lab 01/30/19 1120 01/30/19 1515 01/30/19 2108 01/31/19 0300  WBC 4.0  --   --  4.4  NEUTROABS 2.5  --   --  2.0  HGB 11.1* 10.4* 10.4* 10.2*  HCT 34.7* 32.5* 31.8*  31.8*  MCV 84.0  --   --  83.9  PLT 199  --   --  180   Basic Metabolic Panel: Recent Labs  Lab 01/30/19 1120  NA 140  K 3.9  CL 105  CO2 26  GLUCOSE 102*  BUN 28*  CREATININE 0.91  CALCIUM 9.0   GFR: Estimated Creatinine Clearance: 95 mL/min (by C-G formula based on SCr of 0.91 mg/dL). Liver Function Tests: Recent Labs  Lab 01/30/19 1120  AST 53*  ALT 28  ALKPHOS 62  BILITOT 0.4  PROT 6.9  ALBUMIN 4.1   No results for input(s): LIPASE, AMYLASE in the last 168 hours. No results for input(s): AMMONIA in the last 168 hours. Coagulation Profile: No results for input(s): INR, PROTIME in the last 168 hours. Cardiac Enzymes: No results for input(s): CKTOTAL, CKMB, CKMBINDEX, TROPONINI in the last 168 hours. BNP (last 3 results) No results for input(s): PROBNP in the last 8760 hours. HbA1C: No results for input(s): HGBA1C in the last 72 hours. CBG: Recent Labs  Lab 01/31/19 0740  GLUCAP 78   Lipid Profile: No results for input(s): CHOL, HDL, LDLCALC, TRIG, CHOLHDL, LDLDIRECT in  the last 72 hours. Thyroid Function Tests: Recent Labs    01/30/19 1515  TSH 3.022   Anemia Panel: No results for input(s): VITAMINB12, FOLATE, FERRITIN, TIBC, IRON, RETICCTPCT in the last 72 hours. Sepsis Labs: No results for input(s): PROCALCITON, LATICACIDVEN in the last 168 hours.  Recent Results (from the past 240 hour(s))  SARS Coronavirus 2 (CEPHEID - Performed in [facility] hospital lab), Hosp Order     Status: None   Collection Time: 01/30/19 11:21 AM  Result Value Ref Range Status   SARS Coronavirus 2 NEGATIVE NEGATIVE Final    Comment: (NOTE) If result is NEGATIVE SARS-CoV-2 target nucleic acids are NOT DETECTED. The SARS-CoV-2 RNA is generally detectable in upper and lower  respiratory specimens during the acute phase of infection. The lowest  concentration of SARS-CoV-2 viral copies this assay can detect is 250  copies / mL. A negative result does not preclude SARS-CoV-2 infection  and should not be used as the sole basis for treatment or other  patient management decisions.  A negative result may occur with  improper specimen collection / handling, submission of specimen other  than nasopharyngeal swab, presence of viral mutation(s) within the  areas targeted by this assay, and inadequate number of viral copies  (<250 copies / mL). A negative result must be combined with clinical  observations, patient history, and epidemiological information. If result is POSITIVE SARS-CoV-2 target nucleic acids are DETECTED. The SARS-CoV-2 RNA is generally detectable in upper and lower  respiratory specimens dur ing the acute phase of infection.  Positive  results are indicative of active infection with SARS-CoV-2.  Clinical  correlation with patient history and other diagnostic information is  necessary to determine patient infection status.  Positive results do  not rule out bacterial infection or co-infection with other viruses. If result is PRESUMPTIVE POSTIVE SARS-CoV-2  nucleic acids MAY BE PRESENT.   A presumptive positive result was obtained on the submitted specimen  and confirmed on repeat testing.  While 2019 novel coronavirus  (SARS-CoV-2) nucleic acids may be present in the submitted sample  additional confirmatory testing may be necessary for epidemiological  and / or clinical management purposes  to differentiate between  SARS-CoV-2 and other Sarbecovirus currently known to infect humans.  If clinically indicated additional testing with an alternate test  methodology [phone]([id number]) is advised. The SARS-CoV-2 RNA is generally  detectable in upper and lower respiratory sp ecimens during the acute  phase of infection. The expected result is Negative. Fact Sheet for Patients:  BoilerBrush.com.cyhttps://www.fda.gov/media/136312/download Fact Sheet for Healthcare Providers: https://pope.com/https://www.fda.gov/media/136313/download This test is not yet approved or cleared by the [place] FDA and has been authorized for detection and/or diagnosis of SARS-CoV-2 by FDA under an Emergency Use Authorization (EUA).  This EUA will remain in effect (meaning this test can be used) for the duration of the COVID-19 declaration under Section 564(b)(1) of the Act, 21 U.S.C. section 360bbb-3(b)(1), unless the authorization is terminated or revoked sooner. Performed at [facility], [address] [place], [place] [id number]    Scheduled Meds: . [START ON 02/03/2019] pantoprazole  40 mg Intravenous Q12H  . [START ON 02/03/2019] pantoprazole  40 mg Intravenous Q12H   Continuous Infusions: . sodium chloride 75 mL/hr at 01/31/19 0524  . pantoprozole (PROTONIX) infusion 8 mg/hr (01/31/19 0524)    LOS: 1 day   Merlene Laughtermair Latif Aleanna Menge, DO [facility] PAGER is on AMION  If 7PM-7AM, please contact night-coverage [email or web address] Password [email or web address] 01/31/2019, 7:52 AM

## 2019-02-01 ENCOUNTER — Inpatient Hospital Stay (HOSPITAL_COMMUNITY): Payer: Medicaid Other | Admitting: Anesthesiology

## 2019-02-01 ENCOUNTER — Encounter (HOSPITAL_COMMUNITY): Admission: EM | Payer: Self-pay | Attending: Internal Medicine

## 2019-02-01 ENCOUNTER — Encounter (HOSPITAL_COMMUNITY): Payer: Self-pay | Admitting: *Deleted

## 2019-02-01 DIAGNOSIS — D72819 Decreased white blood cell count, unspecified: Secondary | ICD-10-CM

## 2019-02-01 DIAGNOSIS — K3189 Other diseases of stomach and duodenum: Secondary | ICD-10-CM

## 2019-02-01 DIAGNOSIS — R001 Bradycardia, unspecified: Secondary | ICD-10-CM

## 2019-02-01 DIAGNOSIS — K449 Diaphragmatic hernia without obstruction or gangrene: Secondary | ICD-10-CM

## 2019-02-01 HISTORY — PX: BIOPSY: SHX5522

## 2019-02-01 HISTORY — PX: ESOPHAGOGASTRODUODENOSCOPY (EGD) WITH PROPOFOL: SHX5813

## 2019-02-01 LAB — CBC WITH DIFFERENTIAL/PLATELET
Abs Immature Granulocytes: 0.01 10*3/uL (ref 0.00–0.07)
Basophils Absolute: 0 10*3/uL (ref 0.0–0.1)
Basophils Relative: 1 %
Eosinophils Absolute: 0.2 10*3/uL (ref 0.0–0.5)
Eosinophils Relative: 6 %
HCT: 33.3 % — ABNORMAL LOW (ref 39.0–52.0)
Hemoglobin: 10.6 g/dL — ABNORMAL LOW (ref 13.0–17.0)
Immature Granulocytes: 0 %
Lymphocytes Relative: 31 %
Lymphs Abs: 1 10*3/uL (ref 0.7–4.0)
MCH: 26.8 pg (ref 26.0–34.0)
MCHC: 31.8 g/dL (ref 30.0–36.0)
MCV: 84.3 fL (ref 80.0–100.0)
Monocytes Absolute: 0.4 10*3/uL (ref 0.1–1.0)
Monocytes Relative: 12 %
Neutro Abs: 1.7 10*3/uL (ref 1.7–7.7)
Neutrophils Relative %: 50 %
Platelets: 205 10*3/uL (ref 150–400)
RBC: 3.95 MIL/uL — ABNORMAL LOW (ref 4.22–5.81)
RDW: 12.9 % (ref 11.5–15.5)
WBC: 3.3 10*3/uL — ABNORMAL LOW (ref 4.0–10.5)
nRBC: 0 % (ref 0.0–0.2)

## 2019-02-01 LAB — COMPREHENSIVE METABOLIC PANEL
ALT: 22 U/L (ref 0–44)
AST: 39 U/L (ref 15–41)
Albumin: 3.6 g/dL (ref 3.5–5.0)
Alkaline Phosphatase: 58 U/L (ref 38–126)
Anion gap: 8 (ref 5–15)
BUN: 8 mg/dL (ref 6–20)
CO2: 26 mmol/L (ref 22–32)
Calcium: 8.9 mg/dL (ref 8.9–10.3)
Chloride: 108 mmol/L (ref 98–111)
Creatinine, Ser: 1 mg/dL (ref 0.61–1.24)
GFR calc Af Amer: >60 mL/min (ref >60)
GFR calc non Af Amer: >60 mL/min (ref >60)
Glucose, Bld: 95 mg/dL (ref 70–99)
Potassium: 4.2 mmol/L (ref 3.5–5.1)
Sodium: 142 mmol/L (ref 135–145)
Total Bilirubin: 0.6 mg/dL (ref 0.3–1.2)
Total Protein: 6.4 g/dL — ABNORMAL LOW (ref 6.5–8.1)

## 2019-02-01 LAB — HIV ANTIBODY (ROUTINE TESTING W REFLEX): HIV Screen 4th Generation wRfx: NONREACTIVE

## 2019-02-01 LAB — HEMOGLOBIN A1C
Hgb A1c MFr Bld: 5.7 % — ABNORMAL HIGH (ref 4.8–5.6)
Mean Plasma Glucose: 117 mg/dL

## 2019-02-01 LAB — PHOSPHORUS: Phosphorus: 2.7 mg/dL (ref 2.5–4.6)

## 2019-02-01 LAB — GLUCOSE, CAPILLARY: Glucose-Capillary: 81 mg/dL (ref 70–99)

## 2019-02-01 LAB — MAGNESIUM: Magnesium: 2.1 mg/dL (ref 1.7–2.4)

## 2019-02-01 SURGERY — ESOPHAGOGASTRODUODENOSCOPY (EGD) WITH PROPOFOL
Anesthesia: Monitor Anesthesia Care

## 2019-02-01 MED ORDER — PROPOFOL 10 MG/ML IV BOLUS
INTRAVENOUS | Status: AC
  Filled 2019-02-01: qty 20

## 2019-02-01 MED ORDER — LACTATED RINGERS IV SOLN
INTRAVENOUS | Status: DC
  Administered 2019-02-01: 10:00:00 via INTRAVENOUS

## 2019-02-01 MED ORDER — MIDAZOLAM HCL 5 MG/5ML IJ SOLN
INTRAMUSCULAR | Status: DC | PRN
  Administered 2019-02-01: 2 mg via INTRAVENOUS

## 2019-02-01 MED ORDER — PANTOPRAZOLE SODIUM 40 MG PO TBEC
40.0000 mg | DELAYED_RELEASE_TABLET | Freq: Every day | ORAL | Status: DC
  Administered 2019-02-01: 40 mg via ORAL
  Filled 2019-02-01 (×2): qty 1

## 2019-02-01 MED ORDER — MEPERIDINE HCL 50 MG/ML IJ SOLN: 6.2500 mg | INTRAMUSCULAR | Status: DC | PRN

## 2019-02-01 MED ORDER — STERILE WATER FOR IRRIGATION IR SOLN
Status: DC | PRN
  Administered 2019-02-01: 2.5 mL

## 2019-02-01 MED ORDER — HYDROMORPHONE HCL 1 MG/ML IJ SOLN: 0.2500 mg | INTRAMUSCULAR | Status: DC | PRN

## 2019-02-01 MED ORDER — SODIUM CHLORIDE 0.9 % IV SOLN: INTRAVENOUS | Status: DC

## 2019-02-01 MED ORDER — PROMETHAZINE HCL 25 MG/ML IJ SOLN: 6.2500 mg | INTRAMUSCULAR | Status: DC | PRN

## 2019-02-01 MED ORDER — TRAMADOL HCL 50 MG PO TABS: 50.0000 mg | ORAL_TABLET | Freq: Four times a day (QID) | ORAL | 0 refills | Status: AC | PRN

## 2019-02-01 MED ORDER — HYDROCODONE-ACETAMINOPHEN 7.5-325 MG PO TABS: 1.0000 | ORAL_TABLET | Freq: Once | ORAL | Status: DC | PRN

## 2019-02-01 MED ORDER — BISACODYL 10 MG RE SUPP: 10.0000 mg | Freq: Every day | RECTAL | 0 refills | Status: AC | PRN

## 2019-02-01 MED ORDER — LACTATED RINGERS IV SOLN: INTRAVENOUS | Status: DC

## 2019-02-01 MED ORDER — ONDANSETRON HCL 4 MG PO TABS: 4.0000 mg | ORAL_TABLET | Freq: Four times a day (QID) | ORAL | 0 refills | Status: AC | PRN

## 2019-02-01 MED ORDER — SENNOSIDES-DOCUSATE SODIUM 8.6-50 MG PO TABS: 1.0000 | ORAL_TABLET | Freq: Every evening | ORAL | 0 refills | Status: AC | PRN

## 2019-02-01 MED ORDER — MIDAZOLAM HCL 2 MG/2ML IJ SOLN
INTRAMUSCULAR | Status: AC
  Filled 2019-02-01: qty 2

## 2019-02-01 MED ORDER — PROPOFOL 10 MG/ML IV BOLUS
INTRAVENOUS | Status: DC | PRN
  Administered 2019-02-01: 20 mg via INTRAVENOUS
  Administered 2019-02-01: 40 mg via INTRAVENOUS
  Administered 2019-02-01 (×2): 20 mg via INTRAVENOUS
  Administered 2019-02-01: 60 mg via INTRAVENOUS

## 2019-02-01 MED ORDER — PANTOPRAZOLE SODIUM 40 MG PO TBEC: 40.0000 mg | DELAYED_RELEASE_TABLET | Freq: Every day | ORAL | 0 refills | Status: AC

## 2019-02-01 NOTE — Discharge Summary (Signed)
Physician Discharge Summary  [name] ZOX:[id number] DOB: 04-06-1971 DOA: 01/30/2019  PCP: Jeremy Presto, MD  Admit date: 01/30/2019 Discharge date: 02/01/2019  Admitted From: Prison Disposition:  Prison  Recommendations for Outpatient Follow-up:  1. Follow up with PCP in 1-2 weeks 2. Follow-up with gastroenterology in the outpatient setting 3. Please obtain CMP/CBC, Mag, Phos in one week 4. 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 States that he noticed black stools 3 days ago and had associated symptoms with abdominal pain and discomfort along with some nausea and some dizziness but no vomiting. Patient states that he does get short of breath on exertion but denies any chest pain. States that he had blood clot over 2 years ago and has had a blood clot in his lungs and has been taking his medication twice a day for it. Admitted to taking some naproxen for some calf pain twice a day for over almost a month. Last dose of Eliquis was this morning.He was brought in for his black stools and epigastric pain and evaluated and found to have a suspected upper GI bleed in the setting of NSAID use along with his anticoagulant. Gastroenterology was consulted and [facility] was called admit this patient and he has been placed on a clear liquid diet.  ED Course:In the ED patient had basic blood work done including a CBC, CMP and was found to be FOBT positive. He was given IV Protonix 40 mg and  bolus of normal saline 1 L. SARS coronavirus 2 testing was negative  **Interim History Patient will be continued on PPI gtt and underwent an EGD this morning normal esophagus, small hiatal hernia, nonbleeding erosive gastropathy that was biopsied, normal duodenal bulb and second portion of the duodenum and the rest of the exam was otherwise normal.  I spoke with Dr. Benard Rink of gastroenterology who recommended the patient go on a PPI daily and avoid NSAIDs and aspirin based products.  Patient was deemed stable from a gastroenterology perspective to discharge and follow-up as an outpatient.  Biopsy results will be called to the patient by Dr. Raiford Simmonds.  Patient will need to follow-up with PCP and gastroenterology in the outpatient setting as he is stable for discharge now.  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)   GI bleed   Chronic anticoagulation   NSAID long-term use   History of pulmonary embolus (PE)   History of DVT in adulthood   Dyspepsia   Epigastric pain   Normocytic anemia   Essential hypertension   Hyperglycemia   Elevated AST (SGOT)   Leukopenia   Bradycardia  Acute GI bleeding with melanotic stools suspect an upper GI bleed in the setting of anticoagulation with Eliquislong with concomitant NSAID use NormocyticAnemia -Admit as an inpatient to telemetry and hold antihypertensives along with anticoagulation with Eliquis -Avoid NSAIDs; Patient has been taking naproxen twice a day for the last month -GI consulted and will place on a clear liquid diet but now is NPO for EGD this AM  -C/w Protonix drip  for now and switch to po when ok per GI and after EGD Dr. Jena Gaussourk recommended change to p.o. daily -2 large-bore IVs and will cycle H&H's every 6 -Hb/Hct went from 11.1/34.7 -> 10.4/32.5 -> 10.4/31.8 -> 10.2/31.8 -> 10.4/33.4 -> 10.2/34.4 -> 10.3/32.3 -> 10.6/33.3 -Received a bolus of normal saline in the ED  due to his orthostasis  -Checked Anemia Panel and showed iron level of 91, U IBC of 206, TIBC 297, saturation ratio of 31%, ferritin of 81, folate level of 9.2, and vitamin B12 level of 267 -Likely upper GI bleed in nature given his melena and elevated BUN suspect it is an ulceration -Last dose of Eliquis was the morning of 01/30/2019 and EGD  was  planned for 1000 AM today  -EGD showed gas, small hiatal hernia, nonbleeding erosive gastropathy that was biopsied and a normal duodenal bulb and the second portion of the duodenum and the rest of the examination was otherwise normal.  -Dr. Jena Gaussourk feels the patient likely bled from the NSAID insult to the stomach in the setting of anticoagulation therapy -Patient has not had a bowel movement yet this morning but still complaining of some epigastric pain and described it as 4 out of 10 in severity and states it is not as constant -Continue to Monitor and Repeat as an outpatient and will need to follow-up with PCP and gastroenterology in outpatient setting   Hypertensionbut currently having softer BP -BP was 109/76 this Admission and was a little orthostatic in the ED  -Currentlyholding amlodipine 5 mg p.o. daily along with hydrochlorothiazide 12.5 mg p.o. twice daily but okay to resume at discharge -BP now is 120/88  Hyperglycemia in the setting of Pre-Diabetes  -Check hemoglobin A1c and this was 5.7 -Patient's blood glucose on admission was 102; Blood Glucose this AM on CMP was 95 -CBG this AM was 81 -Continue monitor blood sugars carefully and if consistently elevated will need to place on sensitive and alongside scale insulin  History of DVT and PEs -Currently was holding anticoagulation as above and will need to resume Eliquis when safe to do so per GI recommendations after my discussion with Dr. Jena Gaussourk today he states it is okay to resume Eliquis today so this is been resumed and patient will be discharged  Elevated AST, improved -Likely  reactive; AST was 53 on Admission and is now 39 -Continue monitor and trend -If consistently elevated or not improving will obtain a right upper quadrant ultrasound along with a acute hepatitis panel -Repeat CMP as an outpatient   Leukopenia -Patient's WBC went from 4.4 -> 3.3 -Continue to Monitor and Trend -Repeat CBC as an outpatient   Bradycardia -Asymptomatic and not on any BB -HR this AM was 54; Ranging from 54-60 -Continue to Monitor as an outpatient   Discharge Instructions  Discharge Instructions    Call MD for:  difficulty breathing, headache or visual disturbances   Complete by:  As directed    Call MD for:  extreme fatigue   Complete by:  As directed    Call MD for:  hives   Complete by:  As directed    Call MD for:  persistant dizziness or light-headedness   Complete by:  As directed    Call MD for:  persistant nausea and vomiting   Complete by:  As directed    Call MD for:  redness, tenderness, or signs of infection (pain, swelling, redness, odor or green/yellow discharge around incision site)   Complete by:  As  directed    Call MD for:  severe uncontrolled pain   Complete by:  As directed    Call MD for:  temperature >100.4   Complete by:  As directed    Diet - low sodium heart healthy   Complete by:  As directed    Discharge instructions   Complete by:  As directed    You were cared for by a hospitalist during your hospital stay. If you have any questions about your discharge medications or the care you received while you were in the hospital after you are discharged, you can call the unit and ask to speak with the hospitalist on call if the hospitalist that took care of you is not available. Once you are discharged, your primary care physician will handle any further medical issues. Please note that NO REFILLS for any discharge medications will be authorized once you are discharged, as it is imperative that you return to your primary care physician (or  establish a relationship with a primary care physician if you do not have one) for your aftercare needs so that they can reassess your need for medications and monitor your lab values.  Follow up with PCP and Gastroenterology. Take all medications as prescribed and avoid Nonsteroidal Anti-Inflammatory Drugs and Aspirin and Aspirin based products. Take Protonix daily. If symptoms change or worsen please return to the ED for evaluation   Increase activity slowly   Complete by:  As directed      Allergies as of 02/01/2019   No Known Allergies     Medication List    TAKE these medications   amLODipine 5 MG tablet Commonly known as:  NORVASC Take 5 mg by mouth daily.   apixaban 5 MG Tabs tablet Commonly known as:  Eliquis Take 1 tablet (5 mg total) by mouth 2 (two) times daily.   bisacodyl 10 MG suppository Commonly known as:  DULCOLAX Place 1 suppository (10 mg total) rectally daily as needed for moderate constipation.   hydrochlorothiazide 12.5 MG capsule Commonly known as:  MICROZIDE Take 12.5 mg by mouth daily.   ondansetron 4 MG tablet Commonly known as:  ZOFRAN Take 1 tablet (4 mg total) by mouth every 6 (six) hours as needed for nausea.   pantoprazole 40 MG tablet Commonly known as:  PROTONIX Take 1 tablet (40 mg total) by mouth daily. Start taking on:  February 02, 2019   senna-docusate 8.6-50 MG tablet Commonly known as:  Senokot-S Take 1 tablet by mouth at bedtime as needed for mild constipation.   traMADol 50 MG tablet Commonly known as:  ULTRAM Take 1 tablet (50 mg total) by mouth every 6 (six) hours as needed for moderate pain.       No Known Allergies  Consultations:  Gastroenterology  Procedures/Studies:  No results found. EGD Findings:      The examined esophagus was normal.      A small hiatal hernia was present.      Multiple, 3 mm non-bleeding erosions were found in the prepyloric /       antral region of the stomach. No ulcer or infiltrating  process seen.       There were no stigmata of recent bleeding. Gastric mucosa was biopsied       with a cold forceps for histology. Estimated blood loss: none.      The duodenal bulb and second portion of the duodenum were normal.      The exam was otherwise without abnormality.  Impression:               - Normal esophagus.                           - Small hiatal hernia.                           - Non-bleeding erosive gastropathy. Biopsied.                           - Normal duodenal bulb and second portion of the                            duodenum.                           - The examination was otherwise normal. Patient                            likely bled from NSAID insult to stomach in the                            setting of anticoagulation therapy  Subjective: Seen and examined at bedside and feels as if he was a little bit better. Still had some abdominal pain but not as bad and stated it was a 4/10 in severity and not as constant. Denied any CP or SOB. States his night was ok and that it was "a night." No other concerns or complaints at this time as patient went for EGD and did well afterwards.  GI recommended discharge back to facility on PPI and avoidance of NSAIDs and aspirin based products.  Patient was deemed stable for discharge and he will need follow-up with PCP and gastroenterology in outpatient setting  Discharge Exam: Vitals:   02/01/19 1115 02/01/19 1451  BP: 107/75 120/88  Pulse: 65 68  Resp: 17   Temp:  98.1 F (36.7 C)  SpO2: 97% 100%   Vitals:   02/01/19 1046 02/01/19 1100 02/01/19 1115 02/01/19 1451  BP: 97/69 103/77 107/75 120/88  Pulse: 75 68 65 68  Resp: 17 19 17    Temp: 98 F (36.7 C)   98.1 F (36.7 C)  TempSrc:    Oral  SpO2: 98% 98% 97% 100%  Weight:      Height:       Examination: Physical Exam:  Constitutional: WN/WD Male in NAD appears calm and comfortable watching television prior to his EGD Eyes: Sclerae anicteric. Lids and  conjunctivae normal ENMT: External Ears and Nose appear Normal. Grossly normal Hearing Neck: Appears supple with no JVD Respiratory: CTAB with no appreciable wheezing/rales/rhonchi. Unlabored breathing  Cardiovascular: Bradycardic rate but regular rhythm. No appreciable m/r/g Abdomen: Soft, mildly tender. Slightly distended due to body habitus. Bowel sounds present GU: Deferred Musculoskeletal: No contractures or cyanosis Skin: Warm and dry. No rashes or lesions noted on a limited skin evaluation but does have some scattered tattoos on body Neurologic: CN 2-12 grossly intact with no appreciable focal deficits Psychiatric: Normal mood and affect. Intact judgment and insight. A and O x3  The results of significant diagnostics from this hospitalization (including imaging, microbiology, ancillary and laboratory) are listed below for reference.  Microbiology: Recent Results (from the past 240 hour(s))  SARS Coronavirus 2 (CEPHEID - Performed in [facility] hospital lab), Hosp Order     Status: None   Collection Time: 01/30/19 11:21 AM  Result Value Ref Range Status   SARS Coronavirus 2 NEGATIVE NEGATIVE Final    Comment: (NOTE) If result is NEGATIVE SARS-CoV-2 target nucleic acids are NOT DETECTED. The SARS-CoV-2 RNA is generally detectable in upper and lower  respiratory specimens during the acute phase of infection. The lowest  concentration of SARS-CoV-2 viral copies this assay can detect is 250  copies / mL. A negative result does not preclude SARS-CoV-2 infection  and should not be used as the sole basis for treatment or other  patient management decisions.  A negative result may occur with  improper specimen collection / handling, submission of specimen other  than nasopharyngeal swab, presence of viral mutation(s) within the  areas targeted by this assay, and inadequate number of viral copies  (<250 copies / mL). A negative result must be combined with clinical  observations,  patient history, and epidemiological information. If result is POSITIVE SARS-CoV-2 target nucleic acids are DETECTED. The SARS-CoV-2 RNA is generally detectable in upper and lower  respiratory specimens dur ing the acute phase of infection.  Positive  results are indicative of active infection with SARS-CoV-2.  Clinical  correlation with patient history and other diagnostic information is  necessary to determine patient infection status.  Positive results do  not rule out bacterial infection or co-infection with other viruses. If result is PRESUMPTIVE POSTIVE SARS-CoV-2 nucleic acids MAY BE PRESENT.   A presumptive positive result was obtained on the submitted specimen  and confirmed on repeat testing.  While 2019 novel coronavirus  (SARS-CoV-2) nucleic acids may be present in the submitted sample  additional confirmatory testing may be necessary for epidemiological  and / or clinical management purposes  to differentiate between  SARS-CoV-2 and other Sarbecovirus currently known to infect humans.  If clinically indicated additional testing with an alternate test  methodology [phone]) is advised. The SARS-CoV-2 RNA is generally  detectable in upper and lower respiratory sp ecimens during the acute  phase of infection. The expected result is Negative. Fact Sheet for Patients:  BoilerBrush.com.cy Fact Sheet for Healthcare Providers: https://pope.com/ This test is not yet approved or cleared by the [place] FDA and has been authorized for detection and/or diagnosis of SARS-CoV-2 by FDA under an Emergency Use Authorization (EUA).  This EUA will remain in effect (meaning this test can be used) for the duration of the COVID-19 declaration under Section 564(b)(1) of the Act, 21 U.S.C. section 360bbb-3(b)(1), unless the authorization is terminated or revoked sooner. Performed at [facility], [address] [place], [place] [address]      Labs: BNP (last 3 results) No results for input(s): BNP in the last 8760 hours. Basic Metabolic Panel: Recent Labs  Lab 01/30/19 1120 01/31/19 0300 01/31/19 0939 02/01/19 0703  NA 140 141  --  142  K 3.9 4.3  --  4.2  CL 105 109  --  108  CO2 26 24  --  26  GLUCOSE 102* 103*  --  95  BUN 28* 13  --  8  CREATININE 0.91 0.88  --  1.00  CALCIUM 9.0 8.7*  --  8.9  MG  --  2.0  --  2.1  PHOS  --   --  2.7 2.7   Liver Function Tests: Recent Labs  Lab 01/30/19  1120 01/31/19 0300 02/01/19 0703  AST 53* 47* 39  ALT 28 25 22   ALKPHOS 62 57 58  BILITOT 0.4 0.7 0.6  PROT 6.9 6.4* 6.4*  ALBUMIN 4.1 3.7 3.6   No results for input(s): LIPASE, AMYLASE in the last 168 hours. No results for input(s): AMMONIA in the last 168 hours. CBC: Recent Labs  Lab 01/30/19 1120  01/31/19 0300 01/31/19 0939 01/31/19 1507 01/31/19 2116 02/01/19 0703  WBC 4.0  --  4.4  --   --   --  3.3*  NEUTROABS 2.5  --  2.0  --   --   --  1.7  HGB 11.1*   < > 10.2* 10.4* 10.2* 10.3* 10.6*  HCT 34.7*   < > 31.8* 33.4* 32.4* 32.3* 33.3*  MCV 84.0  --  83.9  --   --   --  84.3  PLT 199  --  180  --   --   --  205   < > = values in this interval not displayed.   Cardiac Enzymes: No results for input(s): CKTOTAL, CKMB, CKMBINDEX, TROPONINI in the last 168 hours. BNP: Invalid input(s): POCBNP CBG: Recent Labs  Lab 01/31/19 0740 02/01/19 0721  GLUCAP 78 81   D-Dimer No results for input(s): DDIMER in the last 72 hours. Hgb A1c Recent Labs    01/30/19 1515  HGBA1C 5.7*   Lipid Profile No results for input(s): CHOL, HDL, LDLCALC, TRIG, CHOLHDL, LDLDIRECT in the last 72 hours. Thyroid function studies Recent Labs    01/30/19 1515  TSH 3.022   Anemia work up Recent Labs    01/31/19 0939  VITAMINB12 267  FOLATE 9.2  FERRITIN 81  TIBC 297  IRON 91  RETICCTPCT 2.2   Urinalysis    Component Value Date/Time   APPEARANCEUR Turbid (A) 01/17/2017 1145   GLUCOSEU Negative 01/17/2017  1145   BILIRUBINUR Negative 01/17/2017 1145   PROTEINUR 1+ (A) 01/17/2017 1145   NITRITE Negative 01/17/2017 1145   LEUKOCYTESUR Negative 01/17/2017 1145   Sepsis Labs Invalid input(s): PROCALCITONIN,  WBC,  LACTICIDVEN Microbiology Recent Results (from the past 240 hour(s))  SARS Coronavirus 2 (CEPHEID - Performed in [facility] hospital lab), Hosp Order     Status: None   Collection Time: 01/30/19 11:21 AM  Result Value Ref Range Status   SARS Coronavirus 2 NEGATIVE NEGATIVE Final    Comment: (NOTE) If result is NEGATIVE SARS-CoV-2 target nucleic acids are NOT DETECTED. The SARS-CoV-2 RNA is generally detectable in upper and lower  respiratory specimens during the acute phase of infection. The lowest  concentration of SARS-CoV-2 viral copies this assay can detect is 250  copies / mL. A negative result does not preclude SARS-CoV-2 infection  and should not be used as the sole basis for treatment or other  patient management decisions.  A negative result may occur with  improper specimen collection / handling, submission of specimen other  than nasopharyngeal swab, presence of viral mutation(s) within the  areas targeted by this assay, and inadequate number of viral copies  (<250 copies / mL). A negative result must be combined with clinical  observations, patient history, and epidemiological information. If result is POSITIVE SARS-CoV-2 target nucleic acids are DETECTED. The SARS-CoV-2 RNA is generally detectable in upper and lower  respiratory specimens dur ing the acute phase of infection.  Positive  results are indicative of active infection with SARS-CoV-2.  Clinical  correlation with patient history and other diagnostic information is  necessary to determine patient infection status.  Positive results do  not rule out bacterial infection or co-infection with other viruses. If result is PRESUMPTIVE POSTIVE SARS-CoV-2 nucleic acids MAY BE PRESENT.   A presumptive positive  result was obtained on the submitted specimen  and confirmed on repeat testing.  While 2019 novel coronavirus  (SARS-CoV-2) nucleic acids may be present in the submitted sample  additional confirmatory testing may be necessary for epidemiological  and / or clinical management purposes  to differentiate between  SARS-CoV-2 and other Sarbecovirus currently known to infect humans.  If clinically indicated additional testing with an alternate test  methodology [phone] is advised. The SARS-CoV-2 RNA is generally  detectable in upper and lower respiratory sp ecimens during the acute  phase of infection. The expected result is Negative. Fact Sheet for Patients:  BoilerBrush.com.cy Fact Sheet for Healthcare Providers: https://pope.com/ This test is not yet approved or cleared by the [place] FDA and has been authorized for detection and/or diagnosis of SARS-CoV-2 by FDA under an Emergency Use Authorization (EUA).  This EUA will remain in effect (meaning this test can be used) for the duration of the COVID-19 declaration under Section 564(b)(1) of the Act, 21 U.S.C. section 360bbb-3(b)(1), unless the authorization is terminated or revoked sooner. Performed at [facility], [address], [place], [place] [address]    Time coordinating discharge: 35 minutes  SIGNED:  Merlene Laughter, DO [facility] 02/01/2019, 4:55 PM Pager is on AMION  If 7PM-7AM, please contact night-coverage [email or web address] Password [email or web address]

## 2019-02-01 NOTE — H&P (Signed)
Patient seen in short stay.  No further bleeding clinically.  Hemoglobin stable at 10.6 no dysphagia.  The risks, benefits, limitations, alternatives and imponderables have been reviewed with the patient. Potential for esophageal dilation, biopsy, etc. have also been reviewed.  Questions have been answered. All parties agreeable.  Further recommendations to follow.

## 2019-02-01 NOTE — Anesthesia Preprocedure Evaluation (Addendum)
Anesthesia Evaluation    Airway Mallampati: II       Dental  (+) Teeth Intact   Pulmonary           Cardiovascular hypertension, On Medications  Rhythm:regular     Neuro/Psych    GI/Hepatic   Endo/Other    Renal/GU      Musculoskeletal   Abdominal   Peds  Hematology  (+) Blood dyscrasia, anemia ,   Anesthesia Other Findings DVT with PE within approx 6 months, anticaogualted GI bleed  Reproductive/Obstetrics                            Anesthesia Physical Anesthesia Plan  ASA: III  Anesthesia Plan: MAC   Post-op Pain Management:    Induction:   PONV Risk Score and Plan:   Airway Management Planned:   Additional Equipment:   Intra-op Plan:   Post-operative Plan:   Informed Consent: I have reviewed the patients History and Physical, chart, labs and discussed the procedure including the risks, benefits and alternatives for the proposed anesthesia with the patient or authorized representative who has indicated his/her understanding and acceptance.       Plan Discussed with: Anesthesiologist  Anesthesia Plan Comments:         Anesthesia Quick Evaluation

## 2019-02-01 NOTE — Progress Notes (Signed)
PROGRESS NOTE    [name]  OZH:[id number] DOB: [birth year] DOA: 01/30/2019 PCP: Vivien Prestoorrington, Kip A, MD   Brief Narrative:  HPI: [name] is a 48 y.o. 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  States that he noticed black stools 3 days ago and had associated symptoms with abdominal pain and discomfort along with some nausea and some dizziness but no vomiting.  Patient states that he does get short of breath on exertion but denies any chest pain.  States that he had blood clot over 2 years ago and has had a blood clot in his lungs and has been taking his medication twice a day for it.  Admitted to taking some naproxen for some calf pain twice a day for over almost a month.  Last dose of Eliquis was this morning.  He was brought in for his black stools and epigastric pain and evaluated and found to have a suspected upper GI bleed in the setting of NSAID use along with his anticoagulant.  Gastroenterology was consulted and [facility] was called admit this patient and he has been placed on a clear liquid diet.  ED Course: In the ED patient had basic blood work done including a CBC, CMP and was found to be FOBT positive.  He was given IV Protonix 40 mg and bolus of normal saline 1 L.  SARS coronavirus 2 testing was negative  **Interim History Patient will be continued on PPI gtt with plans for EGD this AM. Patient is feeling a little bit better and states Pain in abdomen is improved and not as constant. 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)   GI bleed   Chronic anticoagulation   NSAID long-term use   History of pulmonary embolus (PE)   History of DVT in adulthood    Dyspepsia   Epigastric pain   Normocytic anemia   Essential hypertension   Hyperglycemia   Elevated AST (SGOT)   Leukopenia   Bradycardia  Acute GI bleeding with melanotic stools suspect an upper GI bleed in the setting of anticoagulation with Eliquis long with concomitant NSAID use Normocytic Anemia -Admit as an inpatient to telemetry and hold antihypertensives along with anticoagulation with Eliquis -Avoid NSAIDs; Patient has been taking naproxen twice a day for the last month -GI consulted and will place on a clear liquid diet but now is NPO for EGD this AM  -C/w Protonix drip for now and switch to po when ok per GI -2 large-bore IVs and will cycle H&H's every 6 -Hb/Hct went from 11.1/34.7 -> 10.4/32.5 -> 10.4/31.8 -> 10.2/31.8 -> 10.4/33.4 -> 10.2/34.4 -> 10.3/32.3 -> 10.6/33.3 -Received a bolus of normal saline in the ED due to his orthostasis  -Checked Anemia Panel and showed iron level of 91, U IBC of 206, TIBC 297, saturation ratio of 31%, ferritin of 81, folate level of 9.2, and vitamin B12 level of 267 -Likely upper GI bleed in nature given his melena and elevated BUN suspect it is an ulceration -Last dose of Eliquis was the morning of 01/30/2019 and EGD is planned for 1000 AM today  -Patient has not had a bowel movement yet this morning but still complaining of some epigastric  pain and described it as 4 out of 10 in severity and states it is not as constant -Continue to Monitor and Repeat CBC in AM  Hypertension but currently having softer BP -BP was 109/76 this Admission and was a little orthostatic in the ED  -Currently holding amlodipine 5 mg p.o. daily along with hydrochlorothiazide 12.5 mg p.o. twice daily -BP now is 116/89  Hyperglycemia in the setting of Pre-Diabetes  -Check hemoglobin A1c and this was 5.7 -Patient's blood glucose on admission was 102; Blood Glucose this AM on CMP was 95 -CBG this AM was 81 -Continue monitor blood sugars carefully and if  consistently elevated will need to place on sensitive and alongside scale insulin  History of DVT and PEs -Currently holding anticoagulation as above and will need to resume Eliquis when safe to do so per GI recommendations  Elevated AST, improved -Likely reactive; AST was 53 on Admission and is now 39 -Continue monitor and trend -If consistently elevated or not improving will obtain a right upper quadrant ultrasound along with a acute hepatitis panel -Repeat CMP in a.m.  Leukopenia -Patient's WBC went from 4.4 -> 3.3 -Continue to Monitor and Trend -Repeat CBC in AM  Bradycardia -Asymptomatic and not on any BB -HR this AM was 54; Ranging from 54-60 -Continue to Monitor carefully   DVT prophylaxis: SCDs Code Status: FULL CODE Family Communication: None Disposition Plan: Remain Inpatient for continued workup and treatment and plan for EGD in AM   Consultants:   Gastroenterology    Procedures:  EGD being planned for 02/01/2019 at 10:00 AM today   Antimicrobials:  Anti-infectives (From admission, onward)   None     Subjective: Seen and examined at bedside and feels as if he was a little bit better. Still having some abdominal pain but not as bad and stated it was a 4/10 in severity and not as constant. Denied any CP or SOB. States his night was ok and that it was "a night." No other concerns or complaints at this time as patient is to go for EGD this AM.   Objective: Vitals:   01/31/19 2123 02/01/19 0500 02/01/19 0509 02/01/19 0754  BP: 121/78  116/89   Pulse: 60  (!) 54   Resp: 16  16   Temp: 98.5 F (36.9 C)  97.9 F (36.6 C)   TempSrc: Oral  Oral   SpO2: 100%  97% 97%  Weight:  81.2 kg    Height:        Intake/Output Summary (Last 24 hours) at 02/01/2019 0931 Last data filed at 02/01/2019 [id number] Gross per 24 hour  Intake 820 ml  Output 2250 ml  Net -1430 ml   Filed Weights   01/30/19 1602 01/31/19 0500 02/01/19 0500  Weight: 79.5 kg 80.1 kg 81.2 kg    Examination: Physical Exam:  Constitutional: WN/WD Male in NAD appears calm and comfortable watching television prior to his EGD Eyes: Sclerae anicteric. Lids and conjunctivae normal ENMT: External Ears and Nose appear Normal. Grossly normal Hearing Neck: Appears supple with no JVD Respiratory: CTAB with no appreciable wheezing/rales/rhonchi. Unlabored breathing  Cardiovascular: Bradycardic rate but regular rhythm. No appreciable m/r/g Abdomen: Soft, mildly tender. Slightly distended due to body habitus. Bowel sounds present GU: Deferred Musculoskeletal: No contractures or cyanosis Skin: Warm and dry. No rashes or lesions noted on a limited skin evaluation but does have some scattered tattoos on body Neurologic: CN 2-12 grossly intact with no appreciable focal deficits Psychiatric: Normal mood and  affect. Intact judgment and insight. A and O x3  Data Reviewed: I have personally reviewed following labs and imaging studies  CBC: Recent Labs  Lab 01/30/19 1120  01/31/19 0300 01/31/19 0939 01/31/19 1507 01/31/19 2116 02/01/19 0703  WBC 4.0  --  4.4  --   --   --  3.3*  NEUTROABS 2.5  --  2.0  --   --   --  1.7  HGB 11.1*   < > 10.2* 10.4* 10.2* 10.3* 10.6*  HCT 34.7*   < > 31.8* 33.4* 32.4* 32.3* 33.3*  MCV 84.0  --  83.9  --   --   --  84.3  PLT 199  --  180  --   --   --  205   < > = values in this interval not displayed.   Basic Metabolic Panel: Recent Labs  Lab 01/30/19 1120 01/31/19 0300 01/31/19 0939 02/01/19 0703  NA 140 141  --  142  K 3.9 4.3  --  4.2  CL 105 109  --  108  CO2 26 24  --  26  GLUCOSE 102* 103*  --  95  BUN 28* 13  --  8  CREATININE 0.91 0.88  --  1.00  CALCIUM 9.0 8.7*  --  8.9  MG  --  2.0  --  2.1  PHOS  --   --  2.7 2.7   GFR: Estimated Creatinine Clearance: 86.5 mL/min (by C-G formula based on SCr of 1 mg/dL). Liver Function Tests: Recent Labs  Lab 01/30/19 1120 01/31/19 0300 02/01/19 0703  AST 53* 47* 39  ALT 28 25 22   ALKPHOS  62 57 58  BILITOT 0.4 0.7 0.6  PROT 6.9 6.4* 6.4*  ALBUMIN 4.1 3.7 3.6   No results for input(s): LIPASE, AMYLASE in the last 168 hours. No results for input(s): AMMONIA in the last 168 hours. Coagulation Profile: No results for input(s): INR, PROTIME in the last 168 hours. Cardiac Enzymes: No results for input(s): CKTOTAL, CKMB, CKMBINDEX, TROPONINI in the last 168 hours. BNP (last 3 results) No results for input(s): PROBNP in the last 8760 hours. HbA1C: Recent Labs    01/30/19 1515  HGBA1C 5.7*   CBG: Recent Labs  Lab 01/31/19 0740 02/01/19 0721  GLUCAP 78 81   Lipid Profile: No results for input(s): CHOL, HDL, LDLCALC, TRIG, CHOLHDL, LDLDIRECT in the last 72 hours. Thyroid Function Tests: Recent Labs    01/30/19 1515  TSH 3.022   Anemia Panel: Recent Labs    01/31/19 0939  VITAMINB12 267  FOLATE 9.2  FERRITIN 81  TIBC 297  IRON 91  RETICCTPCT 2.2   Sepsis Labs: No results for input(s): PROCALCITON, LATICACIDVEN in the last 168 hours.  Recent Results (from the past 240 hour(s))  SARS Coronavirus 2 (CEPHEID - Performed in [facility] lab), Hosp Order     Status: None   Collection Time: 01/30/19 11:21 AM  Result Value Ref Range Status   SARS Coronavirus 2 NEGATIVE NEGATIVE Final    Comment: (NOTE) If result is NEGATIVE SARS-CoV-2 target nucleic acids are NOT DETECTED. The SARS-CoV-2 RNA is generally detectable in upper and lower  respiratory specimens during the acute phase of infection. The lowest  concentration of SARS-CoV-2 viral copies this assay can detect is 250  copies / mL. A negative result does not preclude SARS-CoV-2 infection  and should not be used as the sole basis for treatment or other  patient management decisions.  A negative result may occur with  improper specimen collection / handling, submission of specimen other  than nasopharyngeal swab, presence of viral mutation(s) within the  areas targeted by this assay, and  inadequate number of viral copies  (<250 copies / mL). A negative result must be combined with clinical  observations, patient history, and epidemiological information. If result is POSITIVE SARS-CoV-2 target nucleic acids are DETECTED. The SARS-CoV-2 RNA is generally detectable in upper and lower  respiratory specimens dur ing the acute phase of infection.  Positive  results are indicative of active infection with SARS-CoV-2.  Clinical  correlation with patient history and other diagnostic information is  necessary to determine patient infection status.  Positive results do  not rule out bacterial infection or co-infection with other viruses. If result is PRESUMPTIVE POSTIVE SARS-CoV-2 nucleic acids MAY BE PRESENT.   A presumptive positive result was obtained on the submitted specimen  and confirmed on repeat testing.  While 2019 novel coronavirus  (SARS-CoV-2) nucleic acids may be present in the submitted sample  additional confirmatory testing may be necessary for epidemiological  and / or clinical management purposes  to differentiate between  SARS-CoV-2 and other Sarbecovirus currently known to infect humans.  If clinically indicated additional testing with an alternate test  methodology [id number]) is advised. The SARS-CoV-2 RNA is generally  detectable in upper and lower respiratory sp ecimens during the acute  phase of infection. The expected result is Negative. Fact Sheet for Patients:  StrictlyIdeas.no Fact Sheet for Healthcare Providers: BankingDealers.co.za This test is not yet approved or cleared by the [place] FDA and has been authorized for detection and/or diagnosis of SARS-CoV-2 by FDA under an Emergency Use Authorization (EUA).  This EUA will remain in effect (meaning this test can be used) for the duration of the COVID-19 declaration under Section 564(b)(1) of the Act, 21 U.S.C. section 360bbb-3(b)(1), unless the  authorization is terminated or revoked sooner. Performed at [facility], [address] [place], [place] [address]    Scheduled Meds: . [START ON 02/03/2019] pantoprazole  40 mg Intravenous Q12H  . [START ON 02/03/2019] pantoprazole  40 mg Intravenous Q12H   Continuous Infusions: . sodium chloride 75 mL/hr at 01/31/19 2121  . pantoprozole (PROTONIX) infusion 8 mg/hr (02/01/19 0412)    LOS: 2 days   Kerney Elbe, DO [facility] PAGER is on [place]  If 7PM-7AM, please contact night-coverage [email or web address] Password [email or web address] 02/01/2019, 9:31 AM

## 2019-02-01 NOTE — Progress Notes (Signed)
IVs removed and discharge instructions reviewed with patient.  Report called to nurse, Johnanna Schneiders, at [facility] in [place].  Patient walked out with guard.

## 2019-02-01 NOTE — Op Note (Signed)
[facility] Patient Name: [name] Procedure Date: 02/01/2019 10:15 AM MRN: [id number] Date of Birth: April 05, 1971 Attending MD: Norvel Richards , MD CSN: [id number] Age: 48 Admit Type: Inpatient Procedure:                Upper GI endoscopy Indications:              Melena (recent Eliquis and naproxen concomitantly Providers:                Norvel Richards, MD, Lurline Del, RN, Nelma Rothman, Technician Referring MD:              Medicines:                Propofol per Anesthesia Complications:            No immediate complications. Estimated Blood Loss:     Estimated blood loss was minimal. Procedure:                Pre-Anesthesia Assessment:                           - Prior to the procedure, a History and Physical                            was performed, and patient medications and                            allergies were reviewed. The patient's tolerance of                            previous anesthesia was also reviewed. The risks                            and benefits of the procedure and the sedation                            options and risks were discussed with the patient.                            All questions were answered, and informed consent                            was obtained. Prior Anticoagulants: The patient has                            taken no previous anticoagulant or antiplatelet                            agents and last took Eliquis (apixaban) 2 days                            prior to the procedure. ASA Grade Assessment: II -  A patient with mild systemic disease. After                            reviewing the risks and benefits, the patient was                            deemed in satisfactory condition to undergo the                            procedure.                           After obtaining informed consent, the endoscope was                            passed under direct  vision. Throughout the                            procedure, the patient's blood pressure, pulse, and                            oxygen saturations were monitored continuously. The                            [id number]-[id number] ([id number] scope was introduced through the                            and advanced to the second part of duodenum. The                            upper GI endoscopy was accomplished with ease. The                            patient tolerated the procedure well. Scope In: 10:30:28 AM Scope Out: 10:35:00 AM Total Procedure Duration: 0 hours 4 minutes 32 seconds  Findings:      The examined esophagus was normal.      A small hiatal hernia was present.      Multiple, 3 mm non-bleeding erosions were found in the prepyloric /       antral region of the stomach. No ulcer or infiltrating process seen.       There were no stigmata of recent bleeding. Gastric mucosa was biopsied       with a cold forceps for histology. Estimated blood loss: none.      The duodenal bulb and second portion of the duodenum were normal.      The exam was otherwise without abnormality. Impression:               - Normal esophagus.                           - Small hiatal hernia.                           - Non-bleeding erosive gastropathy. Biopsied.                           -  Normal duodenal bulb and second portion of the                            duodenum.                           - The examination was otherwise normal. Patient                            likely bled from NSAID insult to stomach in the                            setting of anticoagulation therapy Moderate Sedation:      Moderate (conscious) sedation was personally administered by an       anesthesia professional. The following parameters were monitored: oxygen       saturation, heart rate, blood pressure, respiratory rate, EKG, adequacy       of pulmonary ventilation, and response to care. Recommendation:           - Patient has a  contact number available for                            emergencies. The signs and symptoms of potential                            delayed complications were discussed with the                            patient. Return to normal activities tomorrow.                            Written discharge instructions were provided to the                            patient.                           - Return patient to hospital ward for possible                            discharge same day.                           - Advance diet as tolerated.                           - Continue present medications. Continue Protonix                            at a dose of 40 mg once daily. May resume Eliquis                            TODAY. Patient may be discharged TODAY from a GI  standpoint. I will follow-up on biopsies. Patient                            is to absolutely refrain from taking all                            aspirin/NSAID products if anticoagulation is to be                            continued. At patient's request, I called his                            mother, Adela LankJacqueline at [phone] and reviewed my                            impression and recommendations. Procedure Code(s):        --- Professional ---                           [phone], Esophagogastroduodenoscopy, flexible,                            transoral; with biopsy, single or multiple Diagnosis Code(s):        --- Professional ---                           K44.9, Diaphragmatic hernia without obstruction or                            gangrene                           K31.89, Other diseases of stomach and duodenum                           K92.1, Melena (includes Hematochezia) CPT copyright 2019 American Medical Association. All rights reserved. The codes documented in this report are preliminary and upon coder review may  be revised to meet current compliance requirements. Gerrit Friendsobert M. Isyss Espinal, MD Gennette Pacobert  Michael Evetta Renner, MD 02/01/2019 10:55:56 AM This report has been signed electronically. Number of Addenda: 0

## 2019-02-01 NOTE — Transfer of Care (Signed)
Immediate Anesthesia Transfer of Care Note  Patient: [name]  Procedure(s) Performed: ESOPHAGOGASTRODUODENOSCOPY (EGD) WITH PROPOFOL (N/A ) BIOPSY  Patient Location: PACU  Anesthesia Type:MAC  Level of Consciousness: awake, alert  and oriented  Airway & Oxygen Therapy: Patient Spontanous Breathing and Patient connected to nasal cannula oxygen  Post-op Assessment: Report given to RN and Post -op Vital signs reviewed and stable  Post vital signs: Reviewed and stable  Last Vitals:  Vitals Value Taken Time  BP 97/69   Temp 98   Pulse 79   Resp 16   SpO2 100     Last Pain:  Vitals:   02/01/19 1020  TempSrc: Axillary  PainSc:          Complications: No apparent anesthesia complications

## 2019-02-01 NOTE — Anesthesia Postprocedure Evaluation (Signed)
Anesthesia Post Note  Patient: [name]  Procedure(s) Performed: ESOPHAGOGASTRODUODENOSCOPY (EGD) WITH PROPOFOL (N/A ) BIOPSY  Patient location during evaluation: PACU Anesthesia Type: MAC Level of consciousness: awake, awake and alert, oriented and patient cooperative Pain management: satisfactory to patient Vital Signs Assessment: post-procedure vital signs reviewed and stable Respiratory status: spontaneous breathing and patient connected to nasal cannula oxygen Cardiovascular status: blood pressure returned to baseline and stable Postop Assessment: no headache and no backache Anesthetic complications: no     Last Vitals:  Vitals:   02/01/19 1020 02/01/19 1046  BP:  97/69  Pulse:  75  Resp:  17  Temp: 36.8 C 36.7 C  SpO2:  98%    Last Pain:  Vitals:   02/01/19 1046  TempSrc:   PainSc: 0-No pain                 Darnell Level [place]

## 2019-02-02 LAB — HEPATITIS PANEL, ACUTE
HCV Ab: <0.1 s/co ratio (ref 0.0–0.9)
Hep A IgM: NEGATIVE
Hep B C IgM: NEGATIVE
Hepatitis B Surface Ag: NEGATIVE

## 2019-02-04 ENCOUNTER — Encounter: Payer: Self-pay | Admitting: Internal Medicine

## 2019-02-04 ENCOUNTER — Encounter (HOSPITAL_COMMUNITY): Payer: Self-pay | Admitting: Internal Medicine
